# Patient Record
Sex: Female | Born: 1970
Health system: Southern US, Community
[De-identification: ages and names within clinical notes are randomized; demographics above are authoritative.]

## PROBLEM LIST (undated history)

## (undated) DIAGNOSIS — K611 Rectal abscess: Secondary | ICD-10-CM

## (undated) DIAGNOSIS — F329 Major depressive disorder, single episode, unspecified: Secondary | ICD-10-CM

## (undated) DIAGNOSIS — F32A Depression, unspecified: Secondary | ICD-10-CM

## (undated) DIAGNOSIS — E039 Hypothyroidism, unspecified: Secondary | ICD-10-CM

## (undated) HISTORY — PX: TONSILLECTOMY: SUR1361

## (undated) HISTORY — DX: Rectal abscess: K61.1

## (undated) HISTORY — PX: BREAST LUMPECTOMY: SHX2

## (undated) HISTORY — PX: THYROID SURGERY: SHX805

## (undated) HISTORY — PX: PATELLA RELEASE AND MANIPULATION: SHX2180

---

## 2007-01-26 ENCOUNTER — Inpatient Hospital Stay (HOSPITAL_COMMUNITY): Admission: AD | Admit: 2007-01-26 | Discharge: 2007-01-27 | Payer: Self-pay | Admitting: Gynecology

## 2007-01-29 ENCOUNTER — Inpatient Hospital Stay (HOSPITAL_COMMUNITY): Admission: AD | Admit: 2007-01-29 | Discharge: 2007-01-29 | Payer: Self-pay | Admitting: *Deleted

## 2007-01-31 ENCOUNTER — Inpatient Hospital Stay (HOSPITAL_COMMUNITY): Admission: AD | Admit: 2007-01-31 | Discharge: 2007-01-31 | Payer: Self-pay | Admitting: Obstetrics & Gynecology

## 2007-02-02 ENCOUNTER — Inpatient Hospital Stay (HOSPITAL_COMMUNITY): Admission: AD | Admit: 2007-02-02 | Discharge: 2007-02-02 | Payer: Self-pay | Admitting: *Deleted

## 2008-03-03 ENCOUNTER — Encounter: Admission: RE | Admit: 2008-03-03 | Discharge: 2008-03-03 | Payer: Self-pay | Admitting: Family Medicine

## 2008-04-22 ENCOUNTER — Inpatient Hospital Stay (HOSPITAL_COMMUNITY): Admission: AD | Admit: 2008-04-22 | Discharge: 2008-04-22 | Payer: Self-pay | Admitting: Obstetrics and Gynecology

## 2008-07-21 HISTORY — PX: TUBAL LIGATION: SHX77

## 2008-07-27 IMAGING — US US OB COMP LESS 14 WK
1 series · 14 of 28 positions shown · non-contrast
Comparison: none

CLINICAL DATA: Left lower quadrant pain. Quantitative beta-hCG 319.

TRANSABDOMINAL AND TRANSVAGINAL OB ULTRASOUND, LESS THAN 14 WEEKS:

[Series 1: us ob comp less 14 wk · 0.29mm/px · 14 of 38 slices shown]
[im 2/38]
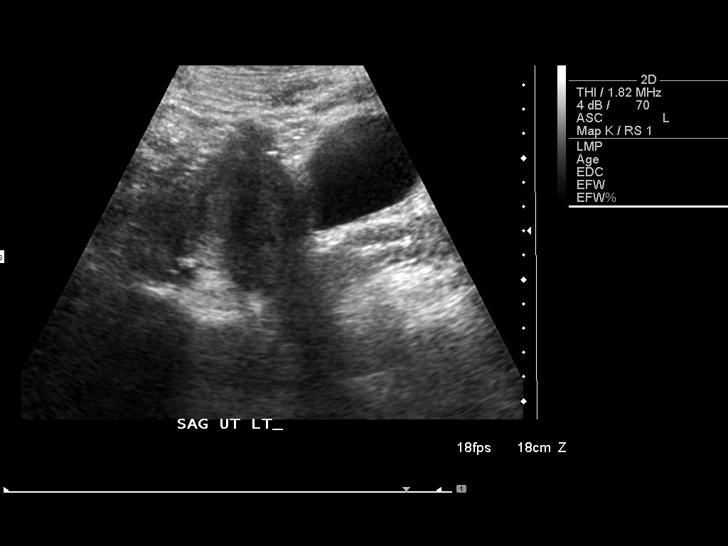
[im 5/38]
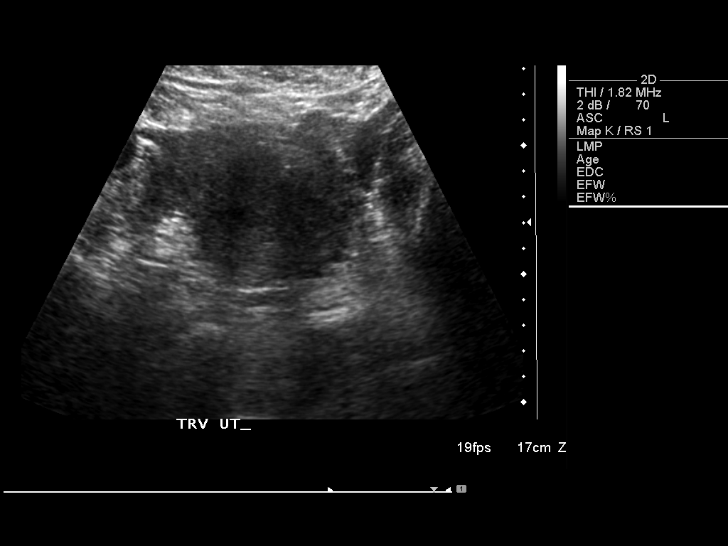
[im 7/38]
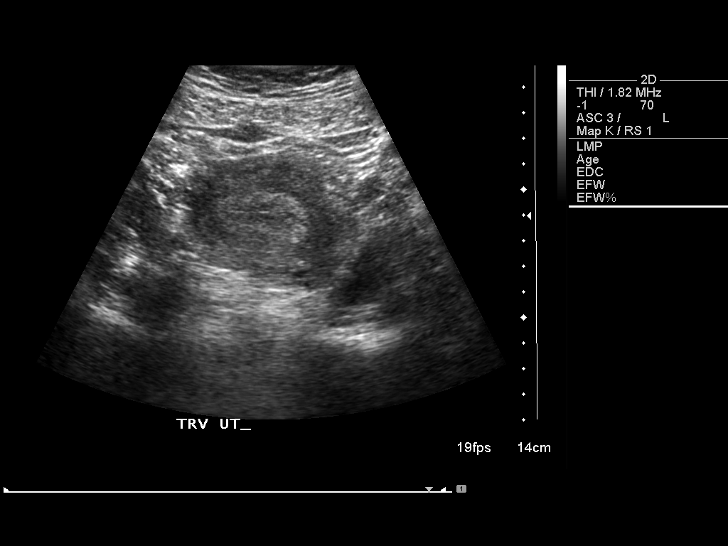
[im 10/38]
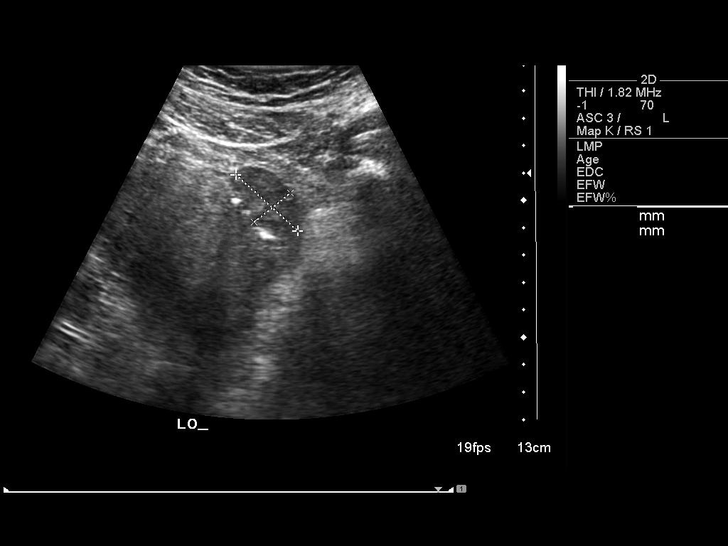
[im 13/38]
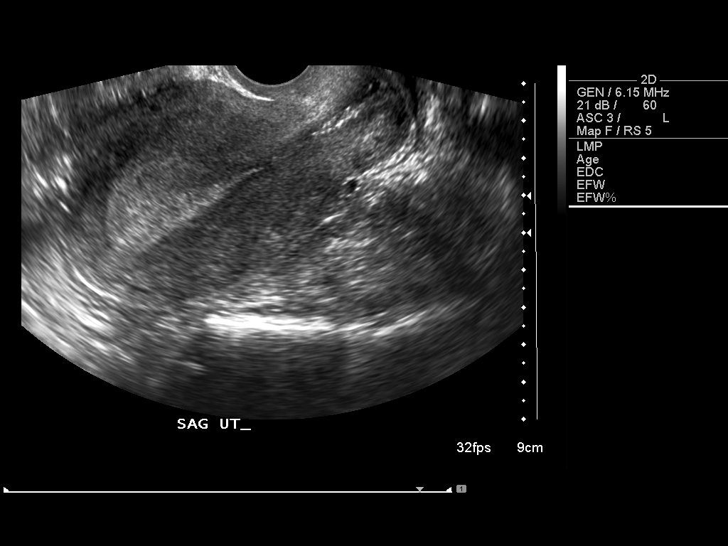
[im 16/38]
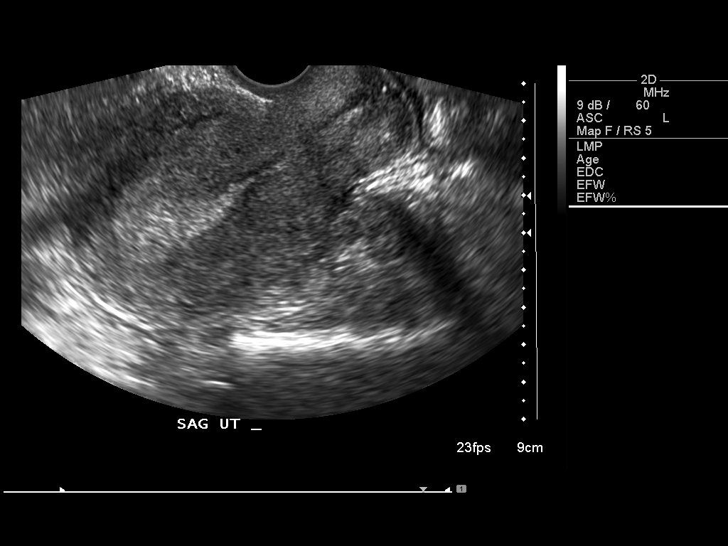
[im 18/38]
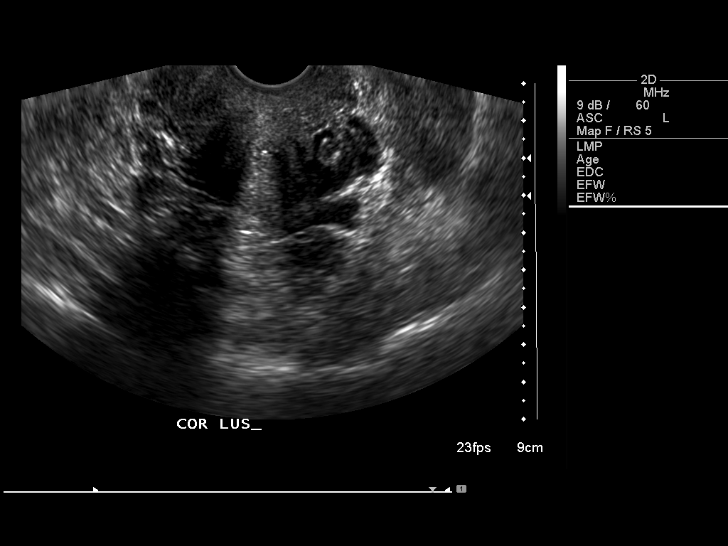
[im 21/38]
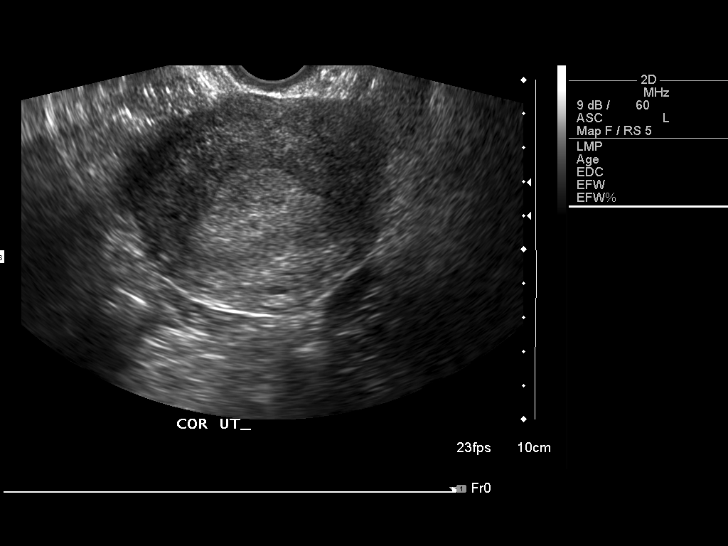
[im 24/38]
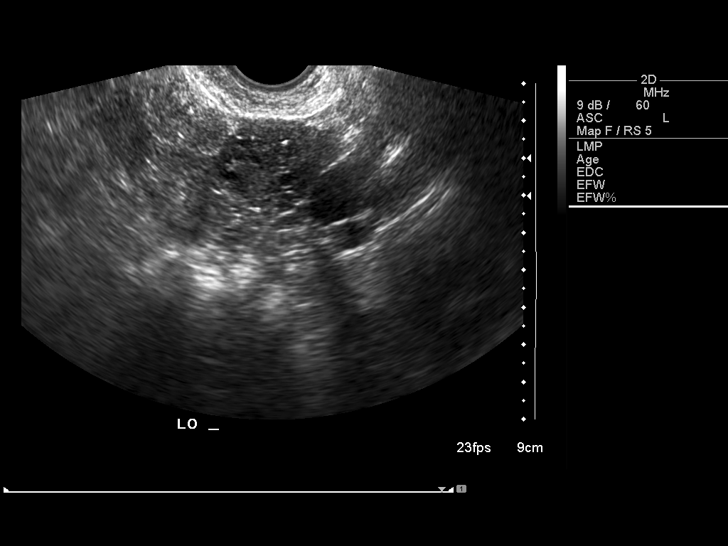
[im 27/38]
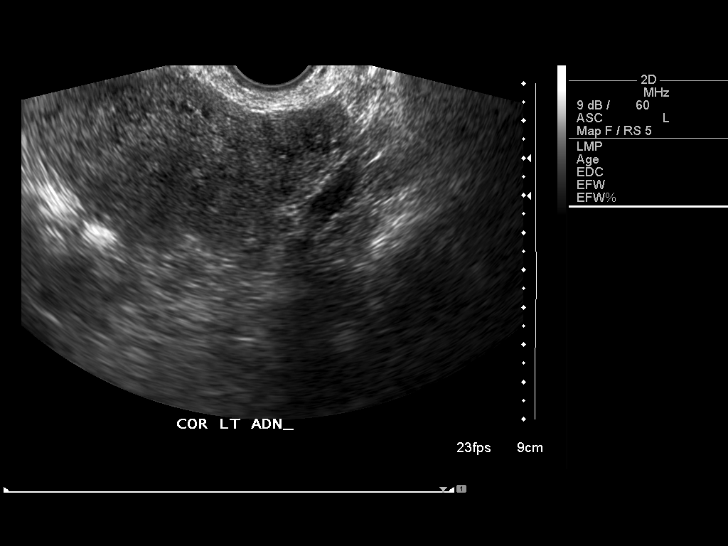
[im 29/38]
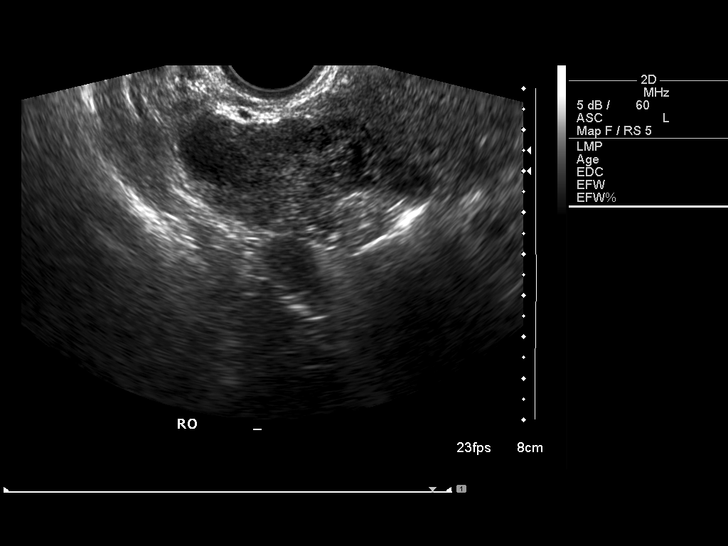
[im 32/38]
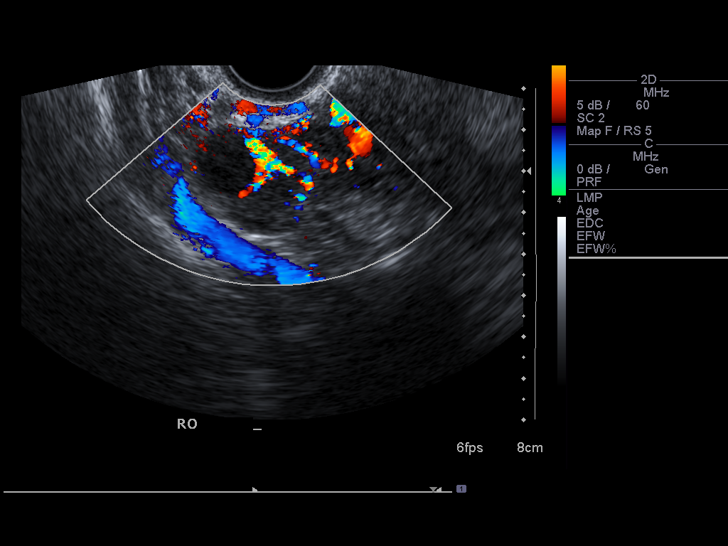
[im 35/38]
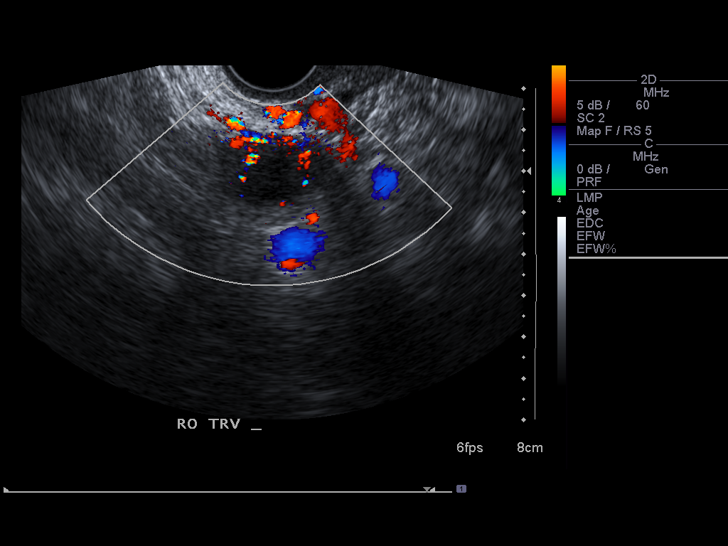
[im 38/38]
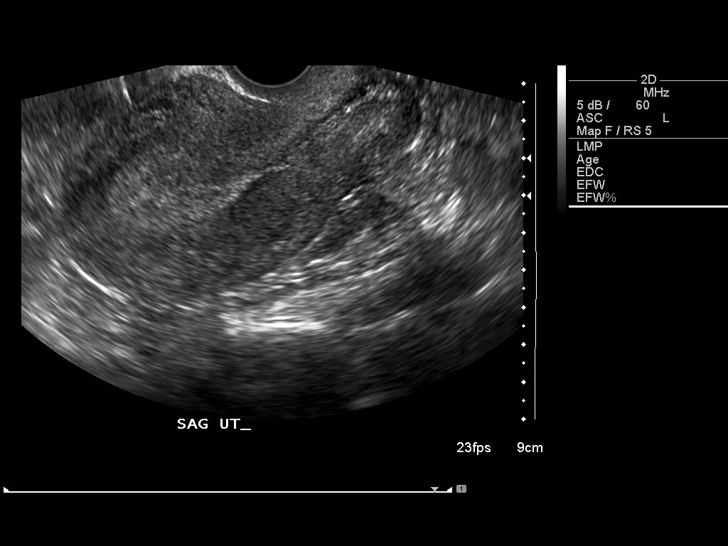

[14 of 28 positions shown; findings below may reference images not displayed]

FINDINGS: No intrauterine gestation is visualized. Ovaries are unremarkable.
Small corpus luteum cyst on the right. No adnexal masses. No free fluid.
IMPRESSION: No intrauterine gestation currently. With quantitative beta-hCG of 319,
differential diagnosis is early IUP to early to visualize, ectopic, or
spontaneous abortion. Recommend clinical followup and serial quantitative
beta-hCG's

## 2008-09-14 ENCOUNTER — Emergency Department (HOSPITAL_COMMUNITY): Admission: EM | Admit: 2008-09-14 | Discharge: 2008-09-14 | Payer: Self-pay | Admitting: Emergency Medicine

## 2008-09-27 ENCOUNTER — Encounter: Admission: RE | Admit: 2008-09-27 | Discharge: 2008-09-27 | Payer: Self-pay | Admitting: Obstetrics and Gynecology

## 2008-12-11 ENCOUNTER — Inpatient Hospital Stay (HOSPITAL_COMMUNITY): Admission: AD | Admit: 2008-12-11 | Discharge: 2008-12-11 | Payer: Self-pay | Admitting: Obstetrics and Gynecology

## 2008-12-21 ENCOUNTER — Inpatient Hospital Stay (HOSPITAL_COMMUNITY): Admission: RE | Admit: 2008-12-21 | Discharge: 2008-12-24 | Payer: Self-pay | Admitting: Obstetrics and Gynecology

## 2010-01-29 ENCOUNTER — Emergency Department (HOSPITAL_COMMUNITY): Admission: EM | Admit: 2010-01-29 | Discharge: 2010-01-29 | Payer: Self-pay | Admitting: Emergency Medicine

## 2010-03-04 ENCOUNTER — Encounter: Admission: RE | Admit: 2010-03-04 | Discharge: 2010-03-04 | Payer: Self-pay | Admitting: Sports Medicine

## 2010-08-12 ENCOUNTER — Encounter: Payer: Self-pay | Admitting: Family Medicine

## 2010-10-28 LAB — COMPREHENSIVE METABOLIC PANEL
ALT: 15 U/L (ref 0–35)
AST: 22 U/L (ref 0–37)
Alkaline Phosphatase: 149 U/L — ABNORMAL HIGH (ref 39–117)
BUN: 1 mg/dL — ABNORMAL LOW (ref 6–23)
CO2: 27 mEq/L (ref 19–32)
Chloride: 108 mEq/L (ref 96–112)
Creatinine, Ser: 0.66 mg/dL (ref 0.4–1.2)
GFR calc non Af Amer: >60 mL/min (ref >60)
Potassium: 4.1 mEq/L (ref 3.5–5.1)
Sodium: 138 mEq/L (ref 135–145)
Total Bilirubin: 0.2 mg/dL — ABNORMAL LOW (ref 0.3–1.2)

## 2010-10-28 LAB — CBC
HCT: 31.1 % — ABNORMAL LOW (ref 36.0–46.0)
HCT: 35.4 % — ABNORMAL LOW (ref 36.0–46.0)
MCV: 88.4 fL (ref 78.0–100.0)
Platelets: 115 10*3/uL — ABNORMAL LOW (ref 150–400)
Platelets: 137 10*3/uL — ABNORMAL LOW (ref 150–400)
RBC: 4.03 MIL/uL (ref 3.87–5.11)
RDW: 14.9 % (ref 11.5–15.5)
RDW: 14.9 % (ref 11.5–15.5)

## 2010-10-28 LAB — TYPE AND SCREEN: ABO/RH(D): A POS

## 2010-10-28 LAB — GLUCOSE, CAPILLARY: Glucose-Capillary: 93 mg/dL (ref 70–99)

## 2010-10-28 LAB — PROTIME-INR: INR: 1 (ref 0.00–1.49)

## 2010-10-28 LAB — HEMOGLOBIN A1C
Hgb A1c MFr Bld: 6.1 % (ref 4.6–6.1)
Mean Plasma Glucose: 128 mg/dL

## 2010-10-29 LAB — GLUCOSE, CAPILLARY

## 2010-11-05 LAB — URINALYSIS, ROUTINE W REFLEX MICROSCOPIC
Bilirubin Urine: NEGATIVE
Glucose, UA: NEGATIVE mg/dL
Hgb urine dipstick: NEGATIVE
Nitrite: NEGATIVE
Specific Gravity, Urine: 1.015 (ref 1.005–1.030)
pH: 7.5 (ref 5.0–8.0)

## 2010-12-03 NOTE — H&P (Signed)
[name], [name]              ACCOUNT NO.:  [id number]   MEDICAL RECORD NO.:  [id number]          PATIENT TYPE:  MAT   LOCATION:  [facility]                          [facility]ACILITY:  [facility]   PHYSICIAN:  Granby B. Earlene Plater, M.D.  DATE OF BIRTH:  [birth year]   DATE OF ADMISSION:  02/02/2007  DATE OF DISCHARGE:  02/02/2007                              HISTORY & PHYSICAL   CHIEF COMPLAINT:  Left lower quadrant pain.   HISTORY OF PRESENT ILLNESS:  This is a 40 year old white female, with  one previous pregnancy 11 years ago, presents with two weeks of  intermittent left lower quadrant pain, sharp or crampy, without fever,  nausea, vomiting, urinary symptoms, or problems with constipation or  diarrhea.  Serial quantitative hCGs have been followed and have been  rising appropriately.  Serial ultrasounds have also been followed in  maternity admissions to rule out ectopy pregnancy.  Initially a small  intrauterine sac was seen, but no yoke sac fetal pole.  A subsequent  ultrasound showed a questionable mass in the left adnexa separate from  the left ovary/cannot rule out heterotopic pregnancy.  The patient  returns today for another repeat ultrasound and follow-up of  quantitative hCG.   PAST MEDICAL HISTORY:  Past medical history is otherwise negative.  No  history of kidney stones.   PHYSICAL EXAMINATION:  VITAL SIGNS:  The patient is afebrile.  Stable  vital signs.  GENERAL:  He is alert and oriented, in no acute distress.  SKIN:  Skin is warm and dry, without lesions.  PELVIC EXAM:  Deferred.   LABORATORIES:  Hemoglobin is 14.7, white count 8.2, quantitative hCG  7079.  Ultrasound today shows intrauterine gestational sac with a yolk  sac.  Bilateral ovaries appear normal.  No evidence for adnexal mass or  fluid in the cul-de-sac or pelvis.   ASSESSMENT:  Intrauterine pregnancy, with left lower quadrant pain.  Doubt heterotopic pregnancy as this is quite rare, and this is a  spontaneous  conception.  Furthermore, there is no evidence for  heterotopic pregnancy on today's ultrasound.   PLAN:  Will check renal ultrasound to rule out for kidney stones.  Assuming this is negative, the patient will monitor symptoms, notify  should they worsen, otherwise, follow up in the office in one week for a  repeat ultrasound for viability.  The patient is given our contact  numbers and recommended that she call the office, rather than return to  the maternity visits for further follow-ups simply to facilitate her  care.      Gerri Spore B. Earlene Plater, M.D.  Electronically Signed     WBD/MEDQ  D:  02/02/2007  T:  02/03/2007  Job:  [id number]

## 2010-12-03 NOTE — H&P (Signed)
[name], [name]              ACCOUNT NO.:  [id number]   MEDICAL RECORD NO.:  [id number]          PATIENT TYPE:  INP   LOCATION:                                [facility]   PHYSICIAN:  Natalie H. Tenny Craw, MD     DATE OF BIRTH:  [birth year]   DATE OF ADMISSION:  12/21/2008  DATE OF DISCHARGE:                              HISTORY & PHYSICAL   CHIEF COMPLAINT:  Scheduled primary low transverse cesarean section.   HISTORY OF PRESENT ILLNESS:  [name] is a 40 year old, gravida 4,  para 0-1-2-1, who presents at 42 weeks and 1 day estimated gestational  age for a primary low transverse cesarean section.  Her pregnancy has  been complicated by advanced maternal age, A2 gestational diabetes  mellitus requiring insulin therapy, fetal macrosomia, mild right renal  pyelectasis of the fetus, and a history of a prior shoulder dystocia  with a fourth-degree perineal laceration.  For these multiple reasons,  she presents today for a scheduled primary low transverse cesarean  section.   PAST MEDICAL HISTORY:  1. Advanced maternal age.  2. History of Hashimoto thyroiditis status post left thyroidectomy in      2004.  3. 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   PAST SURGICAL HISTORY:  1. In 2004, left thyroidectomy.  2. Laparoscopy at 40 years of age.  3. Breast lumpectomy on the left at 40 years of age.  4. Left knee arthroscopy for patellar release.   PAST OBSTETRIC HISTORY:  1. In 1990, an elective pregnancy termination.  2. In May 1997, a female infant was born by vaginal delivery at 64      weeks' gestational age weighing 7 pounds 9 ounces.  This delivery      was complicated with shoulder dystocia and a fourth-degree perineal      laceration.  No permanent neurologic injury to the child.  3. In July 2008, a missed abortion.   PAST GYNECOLOGIC HISTORY:  History of a high-grade squamous  intraepithelial lesion Pap smear during this pregnancy.  Colposcopy  without biopsy was performed.  A  repeat Pap smear was also performed  which came back ASCUS.  Plan for colposcopy with biopsy to be performed  postpartum.  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  No  history of gonorrhea, Chlamydia, herpes or syphilis.  Current pregnancy.   PRENATAL LABORATORY DATA:  A positive, antibody negative, RPR  nonreactive, rubella titer immune, hepatitis B surface antigen negative,  HIV nonreactive, gonorrhea and Chlamydia negative, GBS negative.  First  trimester screen, no increased risk.  Maternal serum AFP, no increased  risk.  One-hour Glucola 240.  Three-hour Glucola also abnormal.  The  patient was diagnosed with gestational diabetes, was originally treated  with glyburide; however, this did not control her blood sugars, and she  was initiated on insulin therapy.  She was also diagnosed with fetal  macrosomia.  An ultrasound on Nov 24, 2007, demonstrated an estimated  fetal weight of 3908 g which is 8 pounds 10 ounces and this is greater  than the 95th percentile.  An amniotic fluid index is 18.4.  At 31 weeks  of gestation, she was also diagnosed with mild right fetal pyelectasis  greater than 7 mm.  This has been stable and we have been following this  with the assistance of Dr. Particia Nearing at [facility].   PHYSICAL EXAMINATION:  Weight 224, blood pressure 110/60.  Alert and oriented x3 in no apparent distress.  Abdomen is gravid, soft, nontender.  No clubbing, cyanosis, 1+ edema   ASSESSMENT AND PLAN:  This is a 40 year old, G4, P0-1-2-1, at 39-2, who  presents for a scheduled primary low transverse cesarean section.  She  also desires permanent sterilization and a tubal ligation will be  performed at the time of C-section.      [name]. Tenny Craw, MD  Electronically Signed     KHR/MEDQ  D:  11/30/2008  T:  11/30/2008  Job:  [id number]

## 2010-12-03 NOTE — Op Note (Signed)
[name], [name]      ACCOUNT NO.:  [id number]   MEDICAL RECORD NO.:  [id number]          PATIENT TYPE:  INP   LOCATION:  9103                          FACILITY:  [facility]   PHYSICIAN:  Kendra H. Tenny Craw, MD     DATE OF BIRTH:  [birth year]   DATE OF PROCEDURE:  12/21/2008  DATE OF DISCHARGE:                               OPERATIVE REPORT   PREOPERATIVE DIAGNOSES:  1. A 39 and 1-week intrauterine pregnancy.  2. Advanced maternal age.  3. A2 diabetes mellitus on insulin,  4. History of shoulder dystocia with a fourth-degree perineal      laceration with her first delivery.  5. Suspected fetal macrosomia.  6. Desired permanent sterilization.   POSTOPERATIVE DIAGNOSES:  1. A 39 and 1-week intrauterine pregnancy.  2. Advanced maternal age.  3. A2 diabetes mellitus on insulin,  4. History of shoulder dystocia with a fourth-degree perineal      laceration with her first delivery.  5. Suspected fetal macrosomia.  6. Desired permanent sterilization.   PROCEDURE:  Primary low transverse cesarean section via Pfannenstiel  skin incision with bilateral tubal ligation with Filshie clips.   SURGEON:  Freddrick March. Tenny Craw, MD   ASSISTANT:  Miguel Aschoff, MD   ANESTHESIA:  Spinal.   OPERATIVE FINDINGS:  Vigorous female infant in the vertex presentation  weighing 10 pounds 1 ounce with Apgars of 9 at 1 and 9 at 5 minutes.  The patient had an enlarged uterus which could not be exteriorized for  repair of the uterine incision and therefore was left in situ.  Normal  ovaries and tubes bilaterally.   SPECIMENS:  Placenta.   DISPOSITION OF SPECIMENS:  To L and D for disposal.   ESTIMATED BLOOD LOSS:  700 mL.   COMPLICATIONS:  None.   PROCEDURE:  Ms. [name] is a 40 year old, gravida 3, para 0-1-1-1, at 31  weeks and 1 day estimated gestational age, who presents today for a  scheduled primary low transverse cesarean section for gestational  diabetes on insulin, suspected fetal macrosomia,  desired permanent  sterilization, and history of a shoulder dystocia with fourth-degree  perineal laceration with her first delivery.  Approximately 13 years  ago, the patient delivered her first child vaginally as she experienced  shoulder dystocia at that time.  The baby was born at 35 weeks with a  weight of 7 pounds 9 ounces.  She did experience a fourth-degree  laceration with that delivery.  During this pregnancy, her pregnancy was  complicated by gestational diabetes requiring insulin to control her  blood sugars.  Serial ultrasounds were performed to monitor fetal growth  during this pregnancy.  The last growth ultrasound performed at 35 weeks  gave an estimated gestational age of 17 g.  In discussing the  suspected fetal macrosomia, and route of delivery it was decided to  proceed with primary low transverse cesarean section for delivery.  The  risks, benefits, and alternatives of this procedure were discussed with  the patient, and we proceeded with cesarean section.   Following the appropriate informed consent, the patient was brought to  the operating room where spinal anesthesia was administered  and found to  be adequate.  She was placed in the dorsal supine position with a  leftward tilt, prepped and draped in the normal sterile fashion.  Allis  test demonstrated adequate anesthesia.  A scalpel was used to make a  Pfannenstiel skin incision which was carried down through the underlying  layers of soft tissue to the fascia.  Fascia was incised in the midline.  Fascial incision was extended laterally with Mayo scissors.  Superior  aspect of the fascial incision was grasped with Kocher clamps x2,  dissected off sharply with the electrocautery unit.  The same procedure  was repeated on the inferior aspect of the fascial incision.  The rectus  muscles were then separated in the midline abdominal peritoneum was  identified, tented up, entered sharply with the Metzenbaum, and  the  incision was extended superiorly and inferiorly with good visualization  of the bladder.  Bladder blade was then inserted.  Vesicouterine  peritoneum was identified, tented up, entered sharply with the  Metzenbaum.  The incision was extended laterally with the Metzenbaum.  The bladder flap was created digitally.  Scalpel was then used to make a  low transverse incision on the uterus which was extended laterally with  blunt dissection.  The bandage scissors were then used to extend the  uterine incision on the left hand aspect of the incision.  Amniotomy was  performed for clear fluid.  Fetal vertex was identified and delivered  atraumatically through the uterine incision followed by the body.  Cord  was clamped and cut.  Infant cried vigorously on the operative field and  was bulb suctioned and passed to the waiting pediatricians.  The  placenta was then spontaneously delivered.  The uterus was cleared of  all clot and debris.  However, given the size of the uterus it could not  be exteriorized for repair of the uterine incision and therefore it was  left in situ.  Ring forceps were placed on the angles of the uterine  incision as well as the upper and lower portions of the uterine  incision, and the uterine incision was reapproximated with a #1 chromic  in a running locked fashion followed by a second imbricating layer.  Several figure-of-eight sutures with chromic were required to achieve  complete hemostasis.  At this time, attention was then turned to the  ovaries and tubes.  The patient desired a tubal ligation, and given the  inability to exteriorize the uterus, decision was made to perform the  tubal ligation procedure with Filshie clips.  The tube on the left side  was identified, grasped with Babcock clamps.  The midportion of the  fallopian tube was identified.  The Filshie clip was applied and noted  to completely encircle the fallopian tube and was hemostatic.  The tube   was then returned to the abdominal cavity, and the same procedure was  repeated on the right hand fallopian tube.  The abdominal cavity was  then irrigated of all clot and debris.  The uterine incision was  reinspected and found to be hemostatic.  The abdominal peritoneum was  reapproximated with 2-0 Vicryl in a running fashion.  The rectus muscles  were reapproximated with #1 chromic in a running fashion.  The fascia  was reapproximated with 0 looped PDS in a running fashion.  Interrupted  stitches were used to reapproximate the subcutaneous tissue with 2-0  plain gut, and the skin was closed with staples.  All sponge, lap,  needle  counts were correct x2.  The patient tolerated the procedure well  and was brought to the recovery room in stable condition following the  procedure.      Freddrick March. Tenny Craw, MD  Electronically Signed    KHR/MEDQ  D:  12/21/2008  T:  12/22/2008  Job:  [id number]

## 2010-12-06 NOTE — Discharge Summary (Signed)
[name], [name]      ACCOUNT NO.:  [id number]   MEDICAL RECORD NO.:  [id number]          PATIENT TYPE:  INP   LOCATION:  9103                          [facility]   PHYSICIAN:  Kendra H. Tenny Craw, MD     DATE OF BIRTH:  12/03/1970   DATE OF ADMISSION:  12/21/2008  DATE OF DISCHARGE:  12/24/2008                               DISCHARGE SUMMARY   FINAL DIAGNOSES:  Intrauterine gestation at 57 and 1/7 weeks,  gestational diabetes mellitus requiring insulin, history of shoulder  dystocia and fourth degree perineal laceration with first delivery,  suspected fetal macrosomia, desired permanent sterilization.   PROCEDURE:  Primary low transverse cesarean section with bilateral tubal  ligation using Filshie clips.   SURGEON:  Freddrick March. Tenny Craw, MD   ASSISTANT:  Miguel Aschoff, MD   COMPLICATIONS:  None.   This 40 year old G70, P1-0-2-1 presents at 20 and 1/7 weeks' gestation  for primary cesarean section.  The patient had a vaginal delivery with  her first pregnancy that resulted in shoulder dystocia and fourth degree  perineal laceration.  The patient also had a history of preeclampsia  with her first pregnancy.  She was gestational diabetic with this  pregnancy and required insulin to control her blood sugars.  All  antepartum testing has been reactive and within normal limits up to this  point.  The patient also expresses her desire for permanent  sterilization and fetal macrosomia suspected.  Discussion was held with  the patient and decision was made to proceed with a primary cesarean  section and tubal ligation.  The patient was taken to the operating room  on December 21, 2008 by Dr. Waynard Reeds, where a primary low transverse  cesarean section was performed with the delivery of 10-pound-1-ounce  female infant with Apgars of 9 and 9.  Delivery went without  complications.  At this point, a bilateral tubal ligation was performed  using Filshie clips, that procedure went without  complications as well.  The patient's postoperative course was benign without any significant  fevers.  The patient had her little boy circumcised prior to discharge.  The patient was felt ready for discharge on postoperative day #3.  She  was sent home on a regular diet, told to decrease activities, told to  continue her prenatal vitamins, was given Percocet 1-2 every 4-6 hours  as needed for pain, was to follow up in our office in 1 week to check  her incision.  Instructions and precautions were reviewed with the  patient.   LABS ON DISCHARGE:  The patient had a hemoglobin of 10.9, white blood  cell count of 10.1, and platelets of 115,000.      [name], P.A.-C.      Freddrick March. Tenny Craw, MD  Electronically Signed    MB/MEDQ  D:  01/08/2009  T:  01/09/2009  Job:  [id number]

## 2011-05-05 LAB — URINALYSIS, ROUTINE W REFLEX MICROSCOPIC
Bilirubin Urine: NEGATIVE
Glucose, UA: NEGATIVE
Hgb urine dipstick: NEGATIVE
Protein, ur: NEGATIVE
Specific Gravity, Urine: >1.03 — ABNORMAL HIGH

## 2011-05-05 LAB — CBC
HCT: 43.4
Hemoglobin: 14.7
MCV: 89
RDW: 13.2

## 2011-05-05 LAB — URINE MICROSCOPIC-ADD ON

## 2011-05-06 LAB — CBC
HCT: 40.2
Hemoglobin: 13.6
MCHC: 33.9
MCV: 88.3
RBC: 4.55

## 2011-05-06 LAB — URINE MICROSCOPIC-ADD ON

## 2011-05-06 LAB — URINALYSIS, ROUTINE W REFLEX MICROSCOPIC
Bilirubin Urine: NEGATIVE
Bilirubin Urine: NEGATIVE
Glucose, UA: NEGATIVE
Glucose, UA: NEGATIVE
Hgb urine dipstick: NEGATIVE
Ketones, ur: NEGATIVE
Nitrite: NEGATIVE
Specific Gravity, Urine: 1.02
pH: 7
pH: 7.5

## 2011-05-06 LAB — WET PREP, GENITAL
Clue Cells Wet Prep HPF POC: NONE SEEN
Trich, Wet Prep: NONE SEEN
Yeast Wet Prep HPF POC: NONE SEEN

## 2011-05-06 LAB — POCT PREGNANCY, URINE
Operator id: 251141
Preg Test, Ur: POSITIVE

## 2012-02-23 ENCOUNTER — Emergency Department (HOSPITAL_COMMUNITY)
Admission: EM | Admit: 2012-02-23 | Discharge: 2012-02-23 | Disposition: A | Discharge status: Home / Self Care | Payer: Medicaid Other | Attending: Emergency Medicine | Admitting: Emergency Medicine

## 2012-02-23 ENCOUNTER — Encounter (HOSPITAL_COMMUNITY): Payer: Self-pay | Admitting: Emergency Medicine

## 2012-02-23 DIAGNOSIS — Z794 Long term (current) use of insulin: Secondary | ICD-10-CM | POA: Insufficient documentation

## 2012-02-23 DIAGNOSIS — F3289 Other specified depressive episodes: Secondary | ICD-10-CM | POA: Insufficient documentation

## 2012-02-23 DIAGNOSIS — E119 Type 2 diabetes mellitus without complications: Secondary | ICD-10-CM | POA: Insufficient documentation

## 2012-02-23 DIAGNOSIS — F329 Major depressive disorder, single episode, unspecified: Secondary | ICD-10-CM | POA: Insufficient documentation

## 2012-02-23 DIAGNOSIS — L02419 Cutaneous abscess of limb, unspecified: Secondary | ICD-10-CM | POA: Insufficient documentation

## 2012-02-23 DIAGNOSIS — Z79899 Other long term (current) drug therapy: Secondary | ICD-10-CM | POA: Insufficient documentation

## 2012-02-23 DIAGNOSIS — L0291 Cutaneous abscess, unspecified: Secondary | ICD-10-CM

## 2012-02-23 HISTORY — DX: Major depressive disorder, single episode, unspecified: F32.9

## 2012-02-23 HISTORY — DX: Depression, unspecified: F32.A

## 2012-02-23 MED ORDER — OXYCODONE-ACETAMINOPHEN 5-325 MG PO TABS
1.0000 | ORAL_TABLET | Freq: Once | ORAL | Status: AC
  Administered 2012-02-23: 1 via ORAL
  Filled 2012-02-23: qty 1

## 2012-02-23 MED ORDER — OXYCODONE-ACETAMINOPHEN 5-325 MG PO TABS: 2.0000 | ORAL_TABLET | ORAL | Status: DC | PRN

## 2012-02-23 MED ORDER — CEPHALEXIN 500 MG PO CAPS: 500.0000 mg | ORAL_CAPSULE | Freq: Four times a day (QID) | ORAL | Status: DC

## 2012-02-23 NOTE — ED Provider Notes (Signed)
History  This chart was scribed for Natalie Guppy, MD by [name]. The patient was seen in room TR04C/TR04C. Patient's care was started at 1814.     CSN: [id number]  Arrival date & time 02/23/12  1814   First MD Initiated Contact with Patient 02/23/12 1849      Chief Complaint  Patient presents with  . Abscess    (Consider location/radiation/quality/duration/timing/severity/associated sxs/prior treatment) The history is provided by the patient. No language interpreter was used.    [name] is a 41 y.o. Female who presents to the Emergency Department complaining of an abscess to her left inner thigh 3 days ago. Associated symptoms include redness, swelling and tenderness. Patient visited her PCP today and was told to come to the ED for an I & D procedure. Patient has a medical history of diabetes and depression. Her surgical history includes C-section, breast lumpectomy, patella release and manipulation and tonsillectomy. She has never smoked.     Past Medical History  Diagnosis Date  . Diabetes mellitus   . Depression     Past Surgical History  Procedure Date  . Cesarean section 2010  . Breast lumpectomy   . Patella release and manipulation   . Tonsillectomy     History reviewed. No pertinent family history.  History  Substance Use Topics  . Smoking status: Never Smoker   . Smokeless tobacco: Not on file  . Alcohol Use: Yes     occasion    OB History    Grav Para Term Preterm Abortions TAB SAB Ect Mult Living                  Review of Systems  Skin:       Positive for abscess.    Allergies  Review of patient's allergies indicates no known allergies.  Home Medications   Current Outpatient Rx  Name Route Sig Dispense Refill  . BUPROPION HCL ER (XL) 150 MG PO TB24 Oral Take 150 mg by mouth daily.    Marland Kitchen CITALOPRAM HYDROBROMIDE 40 MG PO TABS Oral Take 40 mg by mouth daily.    Marland Kitchen HYDROCODONE-ACETAMINOPHEN 7.5-750 MG PO TABS Oral Take 1  tablet by mouth every 6 (six) hours as needed. For pain    . INSULIN GLARGINE 100 UNIT/ML [place] SOLN Subcutaneous Inject 25 Units into the skin at bedtime.    Marland Kitchen METFORMIN HCL 1000 MG PO TABS Oral Take 1,000 mg by mouth 2 (two) times daily with a meal.    . NORTRIPTYLINE HCL 50 MG PO CAPS Oral Take 100 mg by mouth at bedtime.      BP 116/67  Pulse 100  Temp 98.8 F (37.1 C) (Oral)  Resp 18  Ht 5\' 6"  (1.676 m)  Wt 173 lb (78.472 kg)  BMI 27.92 kg/m2  SpO2 99%  Physical Exam  ED Course  Procedures (including critical care time) DIAGNOSTIC STUDIES: Oxygen Saturation is 99% on room air, normal by my interpretation.    COORDINATION OF CARE: 7:13pm- Patient informed of current plan for treatment and evaluation and agrees with plan at this time.    INCISION AND DRAINAGE PROCEDURE NOTE: Patient identification was confirmed and verbal consent was obtained. This procedure was performed by Natalie Guppy, MD at 7:36 PM. Site: right buttock Sterile procedures observed yes Needle size: 23 g Anesthetic used (type and amt): lido with epi 3 ml Blade size: 11 Drainage: large amount Complexity: Complex Packing used iodofoam  Site anesthetized, incision made over site,  wound drained and explored loculations, rinsed with copious amounts of normal saline, wound packed with sterile gauze, covered with dry, sterile dressing.  Pt tolerated procedure well without complications.  Instructions for care discussed verbally and pt provided with additional written instructions for homecare and f/u.   RN Tresa Endo chaperone due to location next to perineum   Labs Reviewed - No data to display  No results found.   No diagnosis found.    MDM  Abscess Drained and packed       I personally performed the services described in this documentation, which was scribed in my presence. The recorded information has been reviewed and considered.    Natalie Guppy, MD 02/23/12 2012

## 2012-02-23 NOTE — ED Notes (Signed)
Pt reports noticing what thought was bug bite to inner thigh on Friday-that has grown over weekend--now reports swollen, red and tender--went to PCP today and was sent here for drainage

## 2012-02-24 ENCOUNTER — Encounter (HOSPITAL_COMMUNITY): Payer: Self-pay | Admitting: *Deleted

## 2012-02-24 ENCOUNTER — Inpatient Hospital Stay (HOSPITAL_COMMUNITY)
Admission: EM | Admit: 2012-02-24 | Discharge: 2012-02-29 | DRG: 581 | Disposition: A | Discharge status: Home / Self Care | Payer: Medicaid Other | Attending: Internal Medicine | Admitting: Internal Medicine

## 2012-02-24 DIAGNOSIS — R739 Hyperglycemia, unspecified: Secondary | ICD-10-CM

## 2012-02-24 DIAGNOSIS — E119 Type 2 diabetes mellitus without complications: Secondary | ICD-10-CM

## 2012-02-24 DIAGNOSIS — B379 Candidiasis, unspecified: Secondary | ICD-10-CM | POA: Diagnosis not present

## 2012-02-24 DIAGNOSIS — E1165 Type 2 diabetes mellitus with hyperglycemia: Secondary | ICD-10-CM

## 2012-02-24 DIAGNOSIS — L0390 Cellulitis, unspecified: Secondary | ICD-10-CM

## 2012-02-24 DIAGNOSIS — B3731 Acute candidiasis of vulva and vagina: Secondary | ICD-10-CM | POA: Diagnosis present

## 2012-02-24 DIAGNOSIS — IMO0001 Reserved for inherently not codable concepts without codable children: Secondary | ICD-10-CM | POA: Diagnosis present

## 2012-02-24 DIAGNOSIS — L03317 Cellulitis of buttock: Secondary | ICD-10-CM | POA: Diagnosis present

## 2012-02-24 DIAGNOSIS — IMO0002 Reserved for concepts with insufficient information to code with codable children: Secondary | ICD-10-CM | POA: Diagnosis present

## 2012-02-24 DIAGNOSIS — L02219 Cutaneous abscess of trunk, unspecified: Principal | ICD-10-CM | POA: Diagnosis present

## 2012-02-24 DIAGNOSIS — L03319 Cellulitis of trunk, unspecified: Principal | ICD-10-CM | POA: Diagnosis present

## 2012-02-24 DIAGNOSIS — B373 Candidiasis of vulva and vagina: Secondary | ICD-10-CM | POA: Diagnosis present

## 2012-02-24 DIAGNOSIS — R300 Dysuria: Secondary | ICD-10-CM

## 2012-02-24 DIAGNOSIS — F329 Major depressive disorder, single episode, unspecified: Secondary | ICD-10-CM | POA: Diagnosis present

## 2012-02-24 DIAGNOSIS — F3289 Other specified depressive episodes: Secondary | ICD-10-CM | POA: Diagnosis present

## 2012-02-24 DIAGNOSIS — L0291 Cutaneous abscess, unspecified: Secondary | ICD-10-CM

## 2012-02-24 LAB — BASIC METABOLIC PANEL
BUN: 7 mg/dL (ref 6–23)
CO2: 26 mEq/L (ref 19–32)
Calcium: 9.5 mg/dL (ref 8.4–10.5)
Chloride: 98 mEq/L (ref 96–112)
Creatinine, Ser: 0.49 mg/dL — ABNORMAL LOW (ref 0.50–1.10)
Sodium: 137 mEq/L (ref 135–145)

## 2012-02-24 LAB — CBC WITH DIFFERENTIAL/PLATELET
Basophils Absolute: 0 10*3/uL (ref 0.0–0.1)
Basophils Relative: 0 % (ref 0–1)
Eosinophils Absolute: 0.2 10*3/uL (ref 0.0–0.7)
Eosinophils Relative: 2 % (ref 0–5)
HCT: 39.5 % (ref 36.0–46.0)
Lymphocytes Relative: 23 % (ref 12–46)
MCHC: 36.2 g/dL — ABNORMAL HIGH (ref 30.0–36.0)
MCV: 85.1 fL (ref 78.0–100.0)
Monocytes Absolute: 0.7 10*3/uL (ref 0.1–1.0)
Platelets: 264 10*3/uL (ref 150–400)
RDW: 12.2 % (ref 11.5–15.5)
WBC: 9.8 10*3/uL (ref 4.0–10.5)

## 2012-02-24 LAB — GLUCOSE, CAPILLARY: Glucose-Capillary: 493 mg/dL — ABNORMAL HIGH (ref 70–99)

## 2012-02-24 MED ORDER — DEXTROSE-NACL 5-0.45 % IV SOLN
INTRAVENOUS | Status: DC
  Administered 2012-02-25: 03:00:00 via INTRAVENOUS

## 2012-02-24 MED ORDER — VANCOMYCIN HCL IN DEXTROSE 1-5 GM/200ML-% IV SOLN
1000.0000 mg | Freq: Once | INTRAVENOUS | Status: AC
  Administered 2012-02-24: 1000 mg via INTRAVENOUS
  Filled 2012-02-24: qty 200

## 2012-02-24 MED ORDER — SODIUM CHLORIDE 0.9 % IV SOLN
INTRAVENOUS | Status: AC
  Administered 2012-02-25: 01:00:00 via INTRAVENOUS
  Filled 2012-02-24: qty 1

## 2012-02-24 MED ORDER — DEXTROSE 50 % IV SOLN: 25.0000 mL | INTRAVENOUS | Status: DC | PRN

## 2012-02-24 MED ORDER — SODIUM CHLORIDE 0.9 % IV SOLN: Freq: Once | INTRAVENOUS | Status: DC

## 2012-02-24 MED ORDER — SODIUM CHLORIDE 0.9 % IV SOLN
INTRAVENOUS | Status: DC
  Administered 2012-02-24 (×2): via INTRAVENOUS

## 2012-02-24 MED ORDER — MORPHINE SULFATE 2 MG/ML IJ SOLN
2.0000 mg | Freq: Once | INTRAMUSCULAR | Status: AC
  Administered 2012-02-24: 2 mg via INTRAVENOUS
  Filled 2012-02-24: qty 1

## 2012-02-24 NOTE — ED Provider Notes (Signed)
History     CSN: [id number]  Arrival date & time 02/24/12  4098   First MD Initiated Contact with Patient 02/24/12 2250      Chief Complaint  Patient presents with  . Abcess   . Hyperglycemia    (Consider location/radiation/quality/duration/timing/severity/associated sxs/prior treatment) HPI Comments: This is a 41 year old woman with uncontrolled diabetes mellitus who presents with a gluteal abscess and cellulitis.  An absess was first noticed 4 days ago.  Yesterday she was evaluated by her PCP and he instructed her to come to the ED for an I&D.  This was performed by Dr. Weldon Inches, and at the time there was no concern for surrounding cellulitis.  She represented to her PCP today, complaining of increased erythema, tenderness, and purulence.  He again advised her to come to the ED for evaluation and treatment.  She reports a fever of 101 at the PCP where she was given 800mg  of ibuprofen.  Here she is afebrile.  She is complaining of painful defecation but no hematochezia.  She endorses dysuria but no hematuria.  She endorses nausea but no vomiting.  The pain associated with this now extends into her lower abdomen.   Past Medical History  Diagnosis Date  . Diabetes mellitus   . Depression     Past Surgical History  Procedure Date  . Cesarean section 2010  . Breast lumpectomy   . Patella release and manipulation   . Tonsillectomy     History reviewed. No pertinent family history.  History  Substance Use Topics  . Smoking status: Never Smoker   . Smokeless tobacco: Not on file  . Alcohol Use: Yes     occasion    OB History    Grav Para Term Preterm Abortions TAB SAB Ect Mult Living                  Review of Systems  All other systems reviewed and are negative.    Allergies  Review of patient's allergies indicates no known allergies.  Home Medications   Current Outpatient Rx  Name Route Sig Dispense Refill  . BUPROPION HCL ER (XL) 150 MG PO TB24 Oral Take 150  mg by mouth daily.    . CEPHALEXIN 500 MG PO CAPS Oral Take 1 capsule (500 mg total) by mouth 4 (four) times daily. 40 capsule 0  . CITALOPRAM HYDROBROMIDE 40 MG PO TABS Oral Take 40 mg by mouth daily.    . INSULIN GLARGINE 100 UNIT/ML [place] SOLN Subcutaneous Inject 25 Units into the skin at bedtime.    Marland Kitchen METFORMIN HCL 1000 MG PO TABS Oral Take 1,000 mg by mouth 2 (two) times daily with a meal.    . NORTRIPTYLINE HCL 50 MG PO CAPS Oral Take 100 mg by mouth at bedtime.    . OXYCODONE-ACETAMINOPHEN 5-325 MG PO TABS Oral Take 2 tablets by mouth every 4 (four) hours as needed for pain. 6 tablet 0    BP 133/88  Pulse 98  Temp 99.5 F (37.5 C) (Oral)  Resp 16  SpO2 100%  Physical Exam  Constitutional: Vital signs are normal. She appears well-developed and well-nourished.  Non-toxic appearance. No distress.  HENT:  Head: Normocephalic and atraumatic.  Nose: Nose normal.  Mouth/Throat: Uvula is midline, oropharynx is clear and moist and mucous membranes are normal.  Cardiovascular: Normal rate, regular rhythm and normal heart sounds.   Pulmonary/Chest: Effort normal and breath sounds normal.  Abdominal: Soft. Normal appearance and bowel sounds  are normal. She exhibits no mass. There is no hepatosplenomegaly. There is tenderness in the left lower quadrant. There is no rigidity and no guarding.  Genitourinary:    Pelvic exam was performed with patient prone.  Lymphadenopathy:       Left: Inguinal adenopathy present.  Neurological: She is alert.    ED Course  Procedures (including critical care time)  Labs Reviewed  GLUCOSE, CAPILLARY - Abnormal; Notable for the following:    Glucose-Capillary 493 (*)     All other components within normal limits  BASIC METABOLIC PANEL - Abnormal; Notable for the following:    Glucose, Bld 340 (*)     Creatinine, Ser 0.49 (*)     All other components within normal limits  CBC WITH DIFFERENTIAL - Abnormal; Notable for the following:    MCHC 36.2 (*)       All other components within normal limits  URINALYSIS, ROUTINE W REFLEX MICROSCOPIC  URINE CULTURE   No results found.   1. Cellulitis and abscess   2. Hyperglycemia without ketosis   3. Dysuria       MDM  1.   Abscess/cellulitis:  There was only mention of abscess in the ED note yesterday, so today's findings of a large area of cellulitis represent rapid progression.  The most common pathogens are S. aureus and S. pyogenes, but with rapid progression, community acquired MRSA is a concern.  For this reason we started treatment with vancomycin IV 1g.  Necrotizing cellulitis/fasciits is less likely since there is no crepitus with exam and no leukocytosis, but with such rapid progression a CT was ordered to assess for extend of infection.  IV morphine for pain.  Will need surgery consult for I&D.  2.   Hyperglycemia:  Patient is on Lantus 25U at night and metformin, but she admits poor control even on this regimen.  This likely contributed to #1.  Her sugar at presentation was 493, K was normal, bicarb was normal, and AG was 13.  She is not in DKA.  We will provide glycemic control with IV insulin via the GlucoStabilizer protocol and hold her metformin.  3.   Dysuria:  Could be secondary to #1 or represent primary UTI.  Urinalysis and urine culture pending.  4.   Disposition:  Deferred to Dr. Dierdre Highman, consult GenSurg after CT results.   Lollie Sails, MD 02/25/12 [phone]

## 2012-02-24 NOTE — ED Provider Notes (Signed)
[name] is a 41 y.o. female with progressive swelling, left buttocks, and perineum. Despite I&D yesterday. Using oral antibiotics, and usual medicines for diabetes. Her drain fell out, and she has persistent drainage from the I&D wound. She feels ill and has generalized aching. On exam, she has a large area of cellulitis, left buttock, extending from a locally, swollen, indurated area of the left inferior perineum.  The area of maximal swelling is moderately tender. No associated skin break down or crepitance, that would indicate subcutaneous air.  Plan as further evaluation, intravenous medications, and fluids, and imaging to rule out deep tissue infection.   Patient seen and evaluated with resident. Pertinent history and physical examination performed. Agree with initial assessment, evaluation and treatment initiated by resident. Face-to-face examination and review of evaluation findings were performed.   Disposition- CT then admit    Flint Melter, MD 02/26/12 2139

## 2012-02-24 NOTE — ED Notes (Signed)
Pt states she was here yesterday and had abscess drained on inner left thigh.  Fevers all day today, last 800 mg ibuprofen at 5:30 pm.  States all packing dislodged

## 2012-02-25 ENCOUNTER — Emergency Department (HOSPITAL_COMMUNITY): Payer: Medicaid Other

## 2012-02-25 ENCOUNTER — Encounter (HOSPITAL_COMMUNITY): Payer: Self-pay | Admitting: Radiology

## 2012-02-25 DIAGNOSIS — E119 Type 2 diabetes mellitus without complications: Secondary | ICD-10-CM

## 2012-02-25 DIAGNOSIS — L0231 Cutaneous abscess of buttock: Secondary | ICD-10-CM

## 2012-02-25 DIAGNOSIS — E1165 Type 2 diabetes mellitus with hyperglycemia: Secondary | ICD-10-CM | POA: Diagnosis present

## 2012-02-25 DIAGNOSIS — L0390 Cellulitis, unspecified: Secondary | ICD-10-CM

## 2012-02-25 DIAGNOSIS — L0291 Cutaneous abscess, unspecified: Secondary | ICD-10-CM

## 2012-02-25 DIAGNOSIS — L03317 Cellulitis of buttock: Secondary | ICD-10-CM | POA: Diagnosis present

## 2012-02-25 DIAGNOSIS — IMO0002 Reserved for concepts with insufficient information to code with codable children: Secondary | ICD-10-CM | POA: Diagnosis present

## 2012-02-25 LAB — CBC WITH DIFFERENTIAL/PLATELET
Basophils Absolute: 0 10*3/uL (ref 0.0–0.1)
Basophils Relative: 0 % (ref 0–1)
Eosinophils Relative: 2 % (ref 0–5)
HCT: 38.9 % (ref 36.0–46.0)
MCH: 30.2 pg (ref 26.0–34.0)
MCHC: 35 g/dL (ref 30.0–36.0)
MCV: 86.3 fL (ref 78.0–100.0)
Monocytes Absolute: 0.9 10*3/uL (ref 0.1–1.0)
RDW: 12.3 % (ref 11.5–15.5)

## 2012-02-25 LAB — GLUCOSE, CAPILLARY
Glucose-Capillary: 267 mg/dL — ABNORMAL HIGH (ref 70–99)
Glucose-Capillary: 204 mg/dL — ABNORMAL HIGH (ref 70–99)
Glucose-Capillary: 161 mg/dL — ABNORMAL HIGH (ref 70–99)
Glucose-Capillary: 242 mg/dL — ABNORMAL HIGH (ref 70–99)
Glucose-Capillary: 247 mg/dL — ABNORMAL HIGH (ref 70–99)

## 2012-02-25 LAB — URINALYSIS, ROUTINE W REFLEX MICROSCOPIC
Bilirubin Urine: NEGATIVE
Protein, ur: NEGATIVE mg/dL
Specific Gravity, Urine: 1.02 (ref 1.005–1.030)
Urobilinogen, UA: 0.2 mg/dL (ref 0.0–1.0)

## 2012-02-25 LAB — COMPREHENSIVE METABOLIC PANEL
ALT: 11 U/L (ref 0–35)
AST: 10 U/L (ref 0–37)
Albumin: 3.2 g/dL — ABNORMAL LOW (ref 3.5–5.2)
Alkaline Phosphatase: 99 U/L (ref 39–117)
GFR calc Af Amer: >90 mL/min (ref >90)
Glucose, Bld: 218 mg/dL — ABNORMAL HIGH (ref 70–99)
Potassium: 3.5 mEq/L (ref 3.5–5.1)
Sodium: 137 mEq/L (ref 135–145)
Total Protein: 6.7 g/dL (ref 6.0–8.3)

## 2012-02-25 LAB — URINE MICROSCOPIC-ADD ON

## 2012-02-25 MED ORDER — OXYCODONE-ACETAMINOPHEN 5-325 MG PO TABS
2.0000 | ORAL_TABLET | ORAL | Status: DC | PRN
  Administered 2012-02-25 – 2012-02-29 (×9): 2 via ORAL
  Filled 2012-02-25 (×10): qty 2

## 2012-02-25 MED ORDER — HYDROMORPHONE HCL PF 1 MG/ML IJ SOLN
1.0000 mg | INTRAMUSCULAR | Status: DC | PRN
  Administered 2012-02-25 – 2012-02-28 (×14): 1 mg via INTRAVENOUS
  Filled 2012-02-25 (×14): qty 1

## 2012-02-25 MED ORDER — DEXTROSE 5 % IV SOLN
1.0000 g | Freq: Two times a day (BID) | INTRAVENOUS | Status: DC
  Administered 2012-02-25 – 2012-02-28 (×7): 1 g via INTRAVENOUS
  Filled 2012-02-25 (×10): qty 1

## 2012-02-25 MED ORDER — NORTRIPTYLINE HCL 25 MG PO CAPS
100.0000 mg | ORAL_CAPSULE | Freq: Every day | ORAL | Status: DC
  Administered 2012-02-25 – 2012-02-28 (×4): 100 mg via ORAL
  Filled 2012-02-25 (×5): qty 4

## 2012-02-25 MED ORDER — BUPROPION HCL ER (XL) 150 MG PO TB24
150.0000 mg | ORAL_TABLET | Freq: Every day | ORAL | Status: DC
  Administered 2012-02-25 – 2012-02-29 (×5): 150 mg via ORAL
  Filled 2012-02-25 (×5): qty 1

## 2012-02-25 MED ORDER — INSULIN GLARGINE 100 UNIT/ML ~~LOC~~ SOLN
30.0000 [IU] | Freq: Every day | SUBCUTANEOUS | Status: DC
  Administered 2012-02-25 – 2012-02-28 (×4): 30 [IU] via SUBCUTANEOUS

## 2012-02-25 MED ORDER — NORTRIPTYLINE HCL 25 MG PO CAPS: 100.0000 mg | ORAL_CAPSULE | Freq: Every day | ORAL | Status: DC

## 2012-02-25 MED ORDER — CITALOPRAM HYDROBROMIDE 40 MG PO TABS
40.0000 mg | ORAL_TABLET | Freq: Every day | ORAL | Status: DC
  Administered 2012-02-25 – 2012-02-29 (×5): 40 mg via ORAL
  Filled 2012-02-25 (×5): qty 1

## 2012-02-25 MED ORDER — LIVING WELL WITH DIABETES BOOK
Freq: Once | Status: DC
  Filled 2012-02-25: qty 1

## 2012-02-25 MED ORDER — INSULIN GLARGINE 100 UNIT/ML ~~LOC~~ SOLN: 25.0000 [IU] | Freq: Every day | SUBCUTANEOUS | Status: DC

## 2012-02-25 MED ORDER — ACETAMINOPHEN 650 MG RE SUPP: 650.0000 mg | Freq: Four times a day (QID) | RECTAL | Status: DC | PRN

## 2012-02-25 MED ORDER — SODIUM CHLORIDE 0.9 % IV SOLN
INTRAVENOUS | Status: DC
  Administered 2012-02-25 – 2012-02-27 (×4): via INTRAVENOUS

## 2012-02-25 MED ORDER — IOHEXOL 300 MG/ML  SOLN
100.0000 mL | Freq: Once | INTRAMUSCULAR | Status: AC | PRN
  Administered 2012-02-25: 100 mL via INTRAVENOUS

## 2012-02-25 MED ORDER — HYDROMORPHONE HCL PF 1 MG/ML IJ SOLN
1.0000 mg | Freq: Once | INTRAMUSCULAR | Status: AC
  Administered 2012-02-25: 1 mg via INTRAVENOUS
  Filled 2012-02-25: qty 1

## 2012-02-25 MED ORDER — ONDANSETRON HCL 4 MG PO TABS: 4.0000 mg | ORAL_TABLET | Freq: Four times a day (QID) | ORAL | Status: DC | PRN

## 2012-02-25 MED ORDER — ONDANSETRON HCL 4 MG/2ML IJ SOLN: 4.0000 mg | Freq: Four times a day (QID) | INTRAMUSCULAR | Status: DC | PRN

## 2012-02-25 MED ORDER — INSULIN ASPART 100 UNIT/ML ~~LOC~~ SOLN
0.0000 [IU] | Freq: Three times a day (TID) | SUBCUTANEOUS | Status: DC
  Administered 2012-02-25 (×3): 5 [IU] via SUBCUTANEOUS
  Administered 2012-02-26 (×2): 3 [IU] via SUBCUTANEOUS

## 2012-02-25 MED ORDER — ACETAMINOPHEN 325 MG PO TABS
650.0000 mg | ORAL_TABLET | Freq: Four times a day (QID) | ORAL | Status: DC | PRN
  Administered 2012-02-27: 650 mg via ORAL
  Filled 2012-02-25 (×2): qty 2

## 2012-02-25 MED ORDER — VANCOMYCIN HCL IN DEXTROSE 1-5 GM/200ML-% IV SOLN
1000.0000 mg | Freq: Two times a day (BID) | INTRAVENOUS | Status: DC
  Administered 2012-02-25 – 2012-02-26 (×3): 1000 mg via INTRAVENOUS
  Filled 2012-02-25 (×3): qty 200

## 2012-02-25 NOTE — Progress Notes (Signed)
Inpatient Diabetes Program Recommendations  AACE/ADA: New Consensus Statement on Inpatient Glycemic Control (2013)  Target Ranges:  Prepandial:   less than 140 mg/dL      Peak postprandial:   less than 180 mg/dL (1-2 hours)      Critically ill patients:  140 - 180 mg/dL   Reason for Visit: Diabetes Consult 41 year old woman with uncontrolled diabetes mellitus who presents with a gluteal abscess and cellulitis. An absess was first noticed 4 days ago. Yesterday she was evaluated by her PCP and he instructed her to come to the ED for an I&D. This was performed by Dr. Weldon Inches, and at the time there was no concern for surrounding cellulitis. She represented to her PCP today, complaining of increased erythema, tenderness, and purulence. He again advised her to come to the ED for evaluation and treatment. She reports a fever of 101 at the PCP where she was given 800mg  of ibuprofen. Here she is afebrile. She is complaining of painful defecation but no hematochezia. She endorses dysuria but no hematuria. She endorses nausea but no vomiting. The pain associated with this now extends into her lower abdomen.  Pt has hx gestational diabetes.  Sees PCP for diabetes management and has recently been on Lantus 25 units QHS and metformin 100 mg bid.  Checks blood sugars at home. For I and D tomorrow am.  Results for [name], [name] (MRN [id number]) as of 02/25/2012 17:10  Ref. Range 02/24/2012 23:04 02/25/2012 06:45  Glucose Latest Range: 70-99 mg/dL 086 (H) 578 (H)  Results for [name], [name] (MRN [id number]) as of 02/25/2012 17:10  Ref. Range 02/24/2012 19:27 02/25/2012 00:32 02/25/2012 01:49 02/25/2012 02:28 02/25/2012 02:58 02/25/2012 04:58 02/25/2012 08:03 02/25/2012 12:17  Glucose-Capillary Latest Range: 70-99 mg/dL 413 (H) 244 (H) 010 (H) 263 (H) 204 (H) 161 (H) 224 (H) 222 (H)   Inpatient Diabetes Program Recommendations Correction (SSI): Add HS coverage Insulin - Meal Coverage: Will probably need meal coverage insulin -  Novolog 4 units tidwc HgbA1C: Needs HgbA1C to assess glycemic control prior to admission. Pt states last one, approx 6 mo ago was 11.  Note: Pt asked good questions regarding diet and medication to control blood sugars.  Encouraged pt to view diabetes videos on pt ed channel and will order Living Well with Diabetes book from pharmacy.  Will probably need to go home on basal/bolus insulin, along with OHAs.  Will follow tomorrow morning.

## 2012-02-25 NOTE — ED Notes (Signed)
Pt is back in room from radiology 

## 2012-02-25 NOTE — ED Notes (Signed)
Stop insulin drip and Dextrose %%-0.45NS per EDP

## 2012-02-25 NOTE — Progress Notes (Signed)
ANTIBIOTIC CONSULT NOTE - INITIAL  Pharmacy Consult for vancomycin/cefepime Indication: cellulitis  No Known Allergies  Patient Measurements: Height: 5\' 6"  (167.6 cm) Weight: 173 lb (78.472 kg) IBW/kg (Calculated) : 59.3   Vital Signs: Temp: 98.1 F (36.7 C) (08/07 0620) Temp src: Oral (08/06 2259) BP: 121/75 mmHg (08/07 0620) Pulse Rate: 86  (08/07 0620)  Labs:  Basename 02/24/12 2304  WBC 9.8  HGB 14.3  PLT 264  LABCREA --  CREATININE 0.49*   Estimated Creatinine Clearance: 98.9 ml/min (by C-G formula based on Cr of 0.49).  Microbiology: No results found for this or any previous visit (from the past 720 hour(s)).  Medical History: Past Medical History  Diagnosis Date  . Diabetes mellitus   . Depression     Medications:  Prescriptions prior to admission  Medication Sig Dispense Refill  . buPROPion (WELLBUTRIN XL) 150 MG 24 hr tablet Take 150 mg by mouth daily.      . cephALEXin (KEFLEX) 500 MG capsule Take 1 capsule (500 mg total) by mouth 4 (four) times daily.  40 capsule  0  . citalopram (CELEXA) 40 MG tablet Take 40 mg by mouth daily.      . insulin glargine (LANTUS) 100 UNIT/ML injection Inject 25 Units into the skin at bedtime.      . metFORMIN (GLUCOPHAGE) 1000 MG tablet Take 1,000 mg by mouth 2 (two) times daily with a meal.      . nortriptyline (PAMELOR) 50 MG capsule Take 100 mg by mouth at bedtime.      Marland Kitchen oxyCODONE-acetaminophen (PERCOCET/ROXICET) 5-325 MG per tablet Take 2 tablets by mouth every 4 (four) hours as needed for pain.  6 tablet  0   Scheduled:    . buPROPion  150 mg Oral Daily  . ceFEPime (MAXIPIME) IV  1 g Intravenous Q12H  . citalopram  40 mg Oral Daily  .  HYDROmorphone (DILAUDID) injection  1 mg Intravenous Once  . insulin aspart  0-15 Units Subcutaneous TID WC  . insulin glargine  25 Units Subcutaneous QHS  . insulin (NOVOLIN-R) infusion   Intravenous To ER  .  morphine injection  2 mg Intravenous Once  . nortriptyline  100 mg  Oral QHS  . vancomycin  1,000 mg Intravenous Once  . vancomycin  1,000 mg Intravenous Q12H  . DISCONTD: sodium chloride   Intravenous Once  . DISCONTD: nortriptyline  100 mg Oral QHS    Assessment: 41yo female presented to ED for small pimple-like lesion on buttock, had I&D in ED and discharged on PO ABX, returned to ED when pain and discharge worsened, now to be admitted for IV ABX for cellulitis.  Goal of Therapy:  Vancomycin trough level 10-15 mcg/ml  Plan:  Rec'd vanc 1g in ED; will continue with vancomycin 1000mg  IV Q12H as well as cefepime 1g IV Q12H and monitor CBC, Cx, levels prn.  Colleen Can PharmD BCPS 02/25/2012,6:37 AM

## 2012-02-25 NOTE — H&P (Signed)
[name] is an 41 y.o. female.   The patient was seen and examined on February 25, 2012 at 5:52 AM. PCP - Ms. Arletha Grippe at [facility]. Chief Complaint: Buttock cellulitis. HPI: 41 year-old female with history of diabetes mellitus2 present with complaints of worsening discharge from the left buttock area. Patient had a small pimple-like lesion on the left buttock area 3 days ago which slowly worsened and got more painful erythematous. Patient had come to the ER night before this and got it incised and drained. And was discharged on oral antibiotics and pain relief medications. Pain and discharge worsened after going home. By evening patient presented back to the ER. CT pelvis was done which shows cellulitis. Patient has been admitted for IV antibiotics. Surgeon on call was consulted by ER physician and will be seeing patient in consult.  Past Medical History  Diagnosis Date  . Diabetes mellitus   . Depression     Past Surgical History  Procedure Date  . Cesarean section 2010  . Breast lumpectomy   . Patella release and manipulation   . Tonsillectomy     Family History  Problem Relation Age of Onset  . Hypertension Mother   . Lung cancer Mother    Social History:  reports that she has never smoked. She does not have any smokeless tobacco history on file. She reports that she drinks alcohol. She reports that she does not use illicit drugs.  Allergies: No Known Allergies   (Not in a hospital admission)  Results for orders placed during the hospital encounter of 02/24/12 (from the past 48 hour(s))  GLUCOSE, CAPILLARY     Status: Abnormal   Collection Time   02/24/12  7:27 PM      Component Value Range Comment   Glucose-Capillary 493 (*) 70 - 99 mg/dL    Comment 1 Documented in Chart      Comment 2 Notify RN     BASIC METABOLIC PANEL     Status: Abnormal   Collection Time   02/24/12 11:04 PM      Component Value Range Comment   Sodium 137  135 - 145 mEq/L    Potassium  4.4  3.5 - 5.1 mEq/L    Chloride 98  96 - 112 mEq/L    CO2 26  19 - 32 mEq/L    Glucose, Bld 340 (*) 70 - 99 mg/dL    BUN 7  6 - 23 mg/dL    Creatinine, Ser 1.61 (*) 0.50 - 1.10 mg/dL    Calcium 9.5  8.4 - 09.6 mg/dL    GFR calc non Af Amer >90  >90 mL/min    GFR calc Af Amer >90  >90 mL/min   CBC WITH DIFFERENTIAL     Status: Abnormal   Collection Time   02/24/12 11:04 PM      Component Value Range Comment   WBC 9.8  4.0 - 10.5 K/uL    RBC 4.64  3.87 - 5.11 MIL/uL    Hemoglobin 14.3  12.0 - 15.0 g/dL    HCT 04.5  40.9 - 81.1 %    MCV 85.1  78.0 - 100.0 fL    MCH 30.8  26.0 - 34.0 pg    MCHC 36.2 (*) 30.0 - 36.0 g/dL    RDW 91.4  78.2 - 95.6 %    Platelets 264  150 - 400 K/uL    Neutrophils Relative 68  43 - 77 %  Neutro Abs 6.6  1.7 - 7.7 K/uL    Lymphocytes Relative 23  12 - 46 %    Lymphs Abs 2.2  0.7 - 4.0 K/uL    Monocytes Relative 8  3 - 12 %    Monocytes Absolute 0.7  0.1 - 1.0 K/uL    Eosinophils Relative 2  0 - 5 %    Eosinophils Absolute 0.2  0.0 - 0.7 K/uL    Basophils Relative 0  0 - 1 %    Basophils Absolute 0.0  0.0 - 0.1 K/uL   URINALYSIS, ROUTINE W REFLEX MICROSCOPIC     Status: Abnormal   Collection Time   02/25/12 12:10 AM      Component Value Range Comment   Color, Urine YELLOW  YELLOW    APPearance CLOUDY (*) CLEAR    Specific Gravity, Urine 1.020  1.005 - 1.030    pH 5.5  5.0 - 8.0    Glucose, UA >1000 (*) NEGATIVE mg/dL    Hgb urine dipstick TRACE (*) NEGATIVE    Bilirubin Urine NEGATIVE  NEGATIVE    Ketones, ur >80 (*) NEGATIVE mg/dL    Protein, ur NEGATIVE  NEGATIVE mg/dL    Urobilinogen, UA 0.2  0.0 - 1.0 mg/dL    Nitrite NEGATIVE  NEGATIVE    Leukocytes, UA TRACE (*) NEGATIVE   URINE MICROSCOPIC-ADD ON     Status: Abnormal   Collection Time   02/25/12 12:10 AM      Component Value Range Comment   Squamous Epithelial / LPF MANY (*) RARE    WBC, UA 7-10  <3 WBC/hpf    RBC / HPF 0-2  <3 RBC/hpf    Bacteria, UA RARE  RARE   GLUCOSE, CAPILLARY      Status: Abnormal   Collection Time   02/25/12 12:32 AM      Component Value Range Comment   Glucose-Capillary 281 (*) 70 - 99 mg/dL   GLUCOSE, CAPILLARY     Status: Abnormal   Collection Time   02/25/12  1:49 AM      Component Value Range Comment   Glucose-Capillary 267 (*) 70 - 99 mg/dL   GLUCOSE, CAPILLARY     Status: Abnormal   Collection Time   02/25/12  2:28 AM      Component Value Range Comment   Glucose-Capillary 263 (*) 70 - 99 mg/dL   GLUCOSE, CAPILLARY     Status: Abnormal   Collection Time   02/25/12  2:58 AM      Component Value Range Comment   Glucose-Capillary 204 (*) 70 - 99 mg/dL   GLUCOSE, CAPILLARY     Status: Abnormal   Collection Time   02/25/12  4:58 AM      Component Value Range Comment   Glucose-Capillary 161 (*) 70 - 99 mg/dL    Ct Pelvis W Contrast  02/25/2012  *RADIOLOGY REPORT*  Clinical Data:  But the bite to the inner thigh over the weekend now with increasing redness, swelling, and tenderness.  CT PELVIS WITH CONTRAST  Technique:  Multidetector CT imaging of the pelvis was performed using the standard protocol following the bolus administration of intravenous contrast.  Contrast: OMNIPAQUE IOHEXOL 300 MG/ML  SOLN  Comparison:   None.  Findings:  There is infiltration or edema in the subcutaneous fat over the left peroneal  region extending to the inferior left gluteal regions.  A tiny gas bubble is identified anteriorly.  No discrete fluid collection to suggest  abscess.  Mild prominence of lymph nodes in the groin bilaterally may represent reactive nodes. Pelvic organs appear unremarkable.  Surgical clips consistent with tubal ligations.  Bones appear intact.  No evidence of focal bone erosion or sclerosis.  IMPRESSION: Left medial peroneal and inferior gluteal subcutaneous fatty infiltration suggesting cellulitis.  Suggestion of a tiny gas bubble focally.  No fluid collection to suggest abscess.  No fistulas demonstrated.  Original Report Authenticated By:  Marlon Pel, M.D.    Review of Systems  Constitutional: Positive for fever and chills.  HENT: Negative.   Eyes: Negative.   Respiratory: Negative.   Cardiovascular: Negative.   Gastrointestinal: Negative.   Genitourinary: Negative.   Musculoskeletal:       Pain and swelling of the buttock area.  Skin: Negative.   Neurological: Negative.   Endo/Heme/Allergies: Negative.   Psychiatric/Behavioral: Negative.     Blood pressure 108/64, pulse 80, temperature 99.5 F (37.5 C), temperature source Oral, resp. rate 16, last menstrual period 02/10/2012, SpO2 98.00%. Physical Exam  Constitutional: She is oriented to person, place, and time. She appears well-developed and well-nourished. No distress.  HENT:  Head: Normocephalic and atraumatic.  Right Ear: External ear normal.  Left Ear: External ear normal.  Nose: Nose normal.  Mouth/Throat: Oropharynx is clear and moist. No oropharyngeal exudate.  Eyes: Conjunctivae are normal. Pupils are equal, round, and reactive to light. Right eye exhibits no discharge. Left eye exhibits no discharge. No scleral icterus.  Neck: Normal range of motion. Neck supple.  Cardiovascular: Normal rate and regular rhythm.   Respiratory: Effort normal and breath sounds normal. No respiratory distress. She has no wheezes. She has no rales.  GI: Soft. Bowel sounds are normal. She exhibits no distension. There is no tenderness. There is no rebound.       Large area encompassing the left buttock area with erythema and induration. Incisional area seen.There is a small area on the right buttock area.  Musculoskeletal: Normal range of motion. She exhibits no edema and no tenderness.  Neurological: She is alert and oriented to person, place, and time.       Moves all extremities.  Skin: She is not diaphoretic.  Psychiatric: Her behavior is normal.     Assessment/Plan #1. Cellulitis of the buttock area - patient has been started on IV antibiotics vancomycin and  cefepime. Get wound cultures. Surgery to see. #2. Uncontrolled diabetes but not in DKA - patient was initially started on IV insulin infusion. Her blood sugar has decreased to less than 250 now. Patient will be placed on moderate dose sliding scale with her regular home dose of insulin Lantus. Patient states it has been increasing getting difficult to control her sugars. Check hemoglobin A1c. #3. History of depression - continue present medications.  CODE STATUS - full code.  Imara Standiford N. 02/25/2012, 5:52 AM

## 2012-02-25 NOTE — Progress Notes (Signed)
Left buttocks/perineal abscess.  Inadequately drained.    Patient had bad experience with local anesthesia for I&D.  So, I will schedule an I&D in the OR tomorrow wider drainage of the abscess.  Discussed with patient and grand mother (in law).  Ovidio Kin, MD, [facility] Pager: [phone] Office phone:  [phone]

## 2012-02-25 NOTE — Consult Note (Signed)
Reason for Consult: Cellulitis of the L groin with draining abscess Referring Physician: Sunnie Nielsen, MD  [name] is an 41 y.o. female. She presented to the ED with an abscess on her L Groin.  An I&D was performed in the ED.  She returned yesterday evening for fevers and was admitted to the hospital.  She has been placed on IV abx.  We were consulted to evaluate for further treatment.  She is a diabetic, and her blood glucose levels have been high over the last few days.  She is currently on Cefepime.    Past Medical History  Diagnosis Date  . Diabetes mellitus   . Depression    Past Surgical History  Procedure Date  . Cesarean section 2010  . Breast lumpectomy   . Patella release and manipulation   . Tonsillectomy    Family History  Problem Relation Age of Onset  . Hypertension Mother   . Lung cancer Mother    Social History:  reports that she has never smoked. She does not have any smokeless tobacco history on file. She reports that she drinks alcohol. She reports that she does not use illicit drugs.  Allergies: No Known Allergies  Medications: I have reviewed the patient's current medications.  Results for orders placed during the hospital encounter of 02/24/12 (from the past 48 hour(s))  GLUCOSE, CAPILLARY     Status: Abnormal   Collection Time   02/24/12  7:27 PM      Component Value Range Comment   Glucose-Capillary 493 (*) 70 - 99 mg/dL    Comment 1 Documented in Chart      Comment 2 Notify RN     BASIC METABOLIC PANEL     Status: Abnormal   Collection Time   02/24/12 11:04 PM      Component Value Range Comment   Sodium 137  135 - 145 mEq/L    Potassium 4.4  3.5 - 5.1 mEq/L    Chloride 98  96 - 112 mEq/L    CO2 26  19 - 32 mEq/L    Glucose, Bld 340 (*) 70 - 99 mg/dL    BUN 7  6 - 23 mg/dL    Creatinine, Ser 4.09 (*) 0.50 - 1.10 mg/dL    Calcium 9.5  8.4 - 81.1 mg/dL    GFR calc non Af Amer >90  >90 mL/min    GFR calc Af Amer >90  >90 mL/min   CBC WITH  DIFFERENTIAL     Status: Abnormal   Collection Time   02/24/12 11:04 PM      Component Value Range Comment   WBC 9.8  4.0 - 10.5 K/uL    RBC 4.64  3.87 - 5.11 MIL/uL    Hemoglobin 14.3  12.0 - 15.0 g/dL    HCT 91.4  78.2 - 95.6 %    MCV 85.1  78.0 - 100.0 fL    MCH 30.8  26.0 - 34.0 pg    MCHC 36.2 (*) 30.0 - 36.0 g/dL    RDW 21.3  08.6 - 57.8 %    Platelets 264  150 - 400 K/uL    Neutrophils Relative 68  43 - 77 %    Neutro Abs 6.6  1.7 - 7.7 K/uL    Lymphocytes Relative 23  12 - 46 %    Lymphs Abs 2.2  0.7 - 4.0 K/uL    Monocytes Relative 8  3 - 12 %    Monocytes  Absolute 0.7  0.1 - 1.0 K/uL    Eosinophils Relative 2  0 - 5 %    Eosinophils Absolute 0.2  0.0 - 0.7 K/uL    Basophils Relative 0  0 - 1 %    Basophils Absolute 0.0  0.0 - 0.1 K/uL   URINALYSIS, ROUTINE W REFLEX MICROSCOPIC     Status: Abnormal   Collection Time   02/25/12 12:10 AM      Component Value Range Comment   Color, Urine YELLOW  YELLOW    APPearance CLOUDY (*) CLEAR    Specific Gravity, Urine 1.020  1.005 - 1.030    pH 5.5  5.0 - 8.0    Glucose, UA >1000 (*) NEGATIVE mg/dL    Hgb urine dipstick TRACE (*) NEGATIVE    Bilirubin Urine NEGATIVE  NEGATIVE    Ketones, ur >80 (*) NEGATIVE mg/dL    Protein, ur NEGATIVE  NEGATIVE mg/dL    Urobilinogen, UA 0.2  0.0 - 1.0 mg/dL    Nitrite NEGATIVE  NEGATIVE    Leukocytes, UA TRACE (*) NEGATIVE   URINE MICROSCOPIC-ADD ON     Status: Abnormal   Collection Time   02/25/12 12:10 AM      Component Value Range Comment   Squamous Epithelial / LPF MANY (*) RARE    WBC, UA 7-10  <3 WBC/hpf    RBC / HPF 0-2  <3 RBC/hpf    Bacteria, UA RARE  RARE   GLUCOSE, CAPILLARY     Status: Abnormal   Collection Time   02/25/12 12:32 AM      Component Value Range Comment   Glucose-Capillary 281 (*) 70 - 99 mg/dL   GLUCOSE, CAPILLARY     Status: Abnormal   Collection Time   02/25/12  1:49 AM      Component Value Range Comment   Glucose-Capillary 267 (*) 70 - 99 mg/dL   GLUCOSE,  CAPILLARY     Status: Abnormal   Collection Time   02/25/12  2:28 AM      Component Value Range Comment   Glucose-Capillary 263 (*) 70 - 99 mg/dL   GLUCOSE, CAPILLARY     Status: Abnormal   Collection Time   02/25/12  2:58 AM      Component Value Range Comment   Glucose-Capillary 204 (*) 70 - 99 mg/dL   GLUCOSE, CAPILLARY     Status: Abnormal   Collection Time   02/25/12  4:58 AM      Component Value Range Comment   Glucose-Capillary 161 (*) 70 - 99 mg/dL   COMPREHENSIVE METABOLIC PANEL     Status: Abnormal   Collection Time   02/25/12  6:45 AM      Component Value Range Comment   Sodium 137  135 - 145 mEq/L    Potassium 3.5  3.5 - 5.1 mEq/L    Chloride 99  96 - 112 mEq/L    CO2 25  19 - 32 mEq/L    Glucose, Bld 218 (*) 70 - 99 mg/dL    BUN 8  6 - 23 mg/dL    Creatinine, Ser 1.61 (*) 0.50 - 1.10 mg/dL    Calcium 9.1  8.4 - 09.6 mg/dL    Total Protein 6.7  6.0 - 8.3 g/dL    Albumin 3.2 (*) 3.5 - 5.2 g/dL    AST 10  0 - 37 U/L    ALT 11  0 - 35 U/L    Alkaline Phosphatase 99  39 - 117 U/L    Total Bilirubin 0.3  0.3 - 1.2 mg/dL    GFR calc non Af Amer >90  >90 mL/min    GFR calc Af Amer >90  >90 mL/min   CBC WITH DIFFERENTIAL     Status: Normal   Collection Time   02/25/12  6:45 AM      Component Value Range Comment   WBC 9.8  4.0 - 10.5 K/uL    RBC 4.51  3.87 - 5.11 MIL/uL    Hemoglobin 13.6  12.0 - 15.0 g/dL    HCT 14.7  82.9 - 56.2 %    MCV 86.3  78.0 - 100.0 fL    MCH 30.2  26.0 - 34.0 pg    MCHC 35.0  30.0 - 36.0 g/dL    RDW 13.0  86.5 - 78.4 %    Platelets 234  150 - 400 K/uL    Neutrophils Relative 68  43 - 77 %    Neutro Abs 6.7  1.7 - 7.7 K/uL    Lymphocytes Relative 21  12 - 46 %    Lymphs Abs 2.0  0.7 - 4.0 K/uL    Monocytes Relative 9  3 - 12 %    Monocytes Absolute 0.9  0.1 - 1.0 K/uL    Eosinophils Relative 2  0 - 5 %    Eosinophils Absolute 0.2  0.0 - 0.7 K/uL    Basophils Relative 0  0 - 1 %    Basophils Absolute 0.0  0.0 - 0.1 K/uL   GLUCOSE, CAPILLARY      Status: Abnormal   Collection Time   02/25/12  8:03 AM      Component Value Range Comment   Glucose-Capillary 224 (*) 70 - 99 mg/dL     Ct Pelvis W Contrast  02/25/2012  *RADIOLOGY REPORT*  Clinical Data:  But the bite to the inner thigh over the weekend now with increasing redness, swelling, and tenderness.  CT PELVIS WITH CONTRAST  Technique:  Multidetector CT imaging of the pelvis was performed using the standard protocol following the bolus administration of intravenous contrast.  Contrast: OMNIPAQUE IOHEXOL 300 MG/ML  SOLN  Comparison:   None.  Findings:  There is infiltration or edema in the subcutaneous fat over the left peroneal  region extending to the inferior left gluteal regions.  A tiny gas bubble is identified anteriorly.  No discrete fluid collection to suggest abscess.  Mild prominence of lymph nodes in the groin bilaterally may represent reactive nodes. Pelvic organs appear unremarkable.  Surgical clips consistent with tubal ligations.  Bones appear intact.  No evidence of focal bone erosion or sclerosis.  IMPRESSION: Left medial peroneal and inferior gluteal subcutaneous fatty infiltration suggesting cellulitis.  Suggestion of a tiny gas bubble focally.  No fluid collection to suggest abscess.  No fistulas demonstrated.  Original Report Authenticated By: Marlon Pel, M.D.   Review of Systems  Constitutional: Positive for fever. Negative for chills.  Respiratory: Negative for cough and shortness of breath.   Cardiovascular: Negative for chest pain and palpitations.  Gastrointestinal: Negative for nausea, vomiting and abdominal pain.  Genitourinary: Negative for dysuria.  Skin: Negative for itching.   Blood pressure 121/75, pulse 86, temperature 98.1 F (36.7 C), temperature source Oral, resp. rate 18, height 5\' 6"  (1.676 m), weight 173 lb (78.472 kg), last menstrual period 02/10/2012, SpO2 100.00%. Physical Exam Gen: No apparent distress Chest: CTA bilaterally CV:  RRR Abd: soft, nontender,  nondistended Incision noted just lateral to L labia, purulence expressed upon palpation.  Mild L groin lymphadenopathy, erythema from superior aspect of the L labia to the level of the anterior aspect of the anus, cellulitis noted throughout this area  Assessment/Plan: May need additional drainage Would cont IV abx today and work on getting blood glucose under control today NPO after Midnight- If area has not improved significantly, may need additional drainage in the OR tom am  Kimberly Nieland C. 02/25/2012, 9:32 AM

## 2012-02-25 NOTE — Care Management Note (Signed)
  Page 1 of 1   02/25/2012     8:30:27 AM   CARE MANAGEMENT NOTE 02/25/2012  Patient:  [name], [name]   Account Number:  [id number]  Date Initiated:  02/25/2012  Documentation initiated by:  Ronny Flurry  Subjective/Objective Assessment:   Cellulitis and abscess     Action/Plan:   Anticipated DC Date:  02/27/2012   Anticipated DC Plan:  HOME/SELF CARE         Choice offered to / List presented to:             Status of service:   Medicare Important Message given?   (If response is "NO", the following Medicare IM given date fields will be blank) Date Medicare IM given:   Date Additional Medicare IM given:    Discharge Disposition:    Per UR Regulation:  Reviewed for med. necessity/level of care/duration of stay  If discussed at Long Length of Stay Meetings, dates discussed:    Comments:

## 2012-02-25 NOTE — ED Provider Notes (Signed)
Results for orders placed during the hospital encounter of 02/24/12  GLUCOSE, CAPILLARY      Component Value Range   Glucose-Capillary 493 (*) 70 - 99 mg/dL   Comment 1 Documented in Chart     Comment 2 Notify RN    BASIC METABOLIC PANEL      Component Value Range   Sodium 137  135 - 145 mEq/L   Potassium 4.4  3.5 - 5.1 mEq/L   Chloride 98  96 - 112 mEq/L   CO2 26  19 - 32 mEq/L   Glucose, Bld 340 (*) 70 - 99 mg/dL   BUN 7  6 - 23 mg/dL   Creatinine, Ser 4.09 (*) 0.50 - 1.10 mg/dL   Calcium 9.5  8.4 - 81.1 mg/dL   GFR calc non Af Amer >90  >90 mL/min   GFR calc Af Amer >90  >90 mL/min  CBC WITH DIFFERENTIAL      Component Value Range   WBC 9.8  4.0 - 10.5 K/uL   RBC 4.64  3.87 - 5.11 MIL/uL   Hemoglobin 14.3  12.0 - 15.0 g/dL   HCT 91.4  78.2 - 95.6 %   MCV 85.1  78.0 - 100.0 fL   MCH 30.8  26.0 - 34.0 pg   MCHC 36.2 (*) 30.0 - 36.0 g/dL   RDW 21.3  08.6 - 57.8 %   Platelets 264  150 - 400 K/uL   Neutrophils Relative 68  43 - 77 %   Neutro Abs 6.6  1.7 - 7.7 K/uL   Lymphocytes Relative 23  12 - 46 %   Lymphs Abs 2.2  0.7 - 4.0 K/uL   Monocytes Relative 8  3 - 12 %   Monocytes Absolute 0.7  0.1 - 1.0 K/uL   Eosinophils Relative 2  0 - 5 %   Eosinophils Absolute 0.2  0.0 - 0.7 K/uL   Basophils Relative 0  0 - 1 %   Basophils Absolute 0.0  0.0 - 0.1 K/uL  URINALYSIS, ROUTINE W REFLEX MICROSCOPIC      Component Value Range   Color, Urine YELLOW  YELLOW   APPearance CLOUDY (*) CLEAR   Specific Gravity, Urine 1.020  1.005 - 1.030   pH 5.5  5.0 - 8.0   Glucose, UA >1000 (*) NEGATIVE mg/dL   Hgb urine dipstick TRACE (*) NEGATIVE   Bilirubin Urine NEGATIVE  NEGATIVE   Ketones, ur >80 (*) NEGATIVE mg/dL   Protein, ur NEGATIVE  NEGATIVE mg/dL   Urobilinogen, UA 0.2  0.0 - 1.0 mg/dL   Nitrite NEGATIVE  NEGATIVE   Leukocytes, UA TRACE (*) NEGATIVE  GLUCOSE, CAPILLARY      Component Value Range   Glucose-Capillary 281 (*) 70 - 99 mg/dL  URINE MICROSCOPIC-ADD ON   Component Value Range   Squamous Epithelial / LPF MANY (*) RARE   WBC, UA 7-10  <3 WBC/hpf   RBC / HPF 0-2  <3 RBC/hpf   Bacteria, UA RARE  RARE  GLUCOSE, CAPILLARY      Component Value Range   Glucose-Capillary 267 (*) 70 - 99 mg/dL  GLUCOSE, CAPILLARY      Component Value Range   Glucose-Capillary 263 (*) 70 - 99 mg/dL  GLUCOSE, CAPILLARY      Component Value Range   Glucose-Capillary 204 (*) 70 - 99 mg/dL   Ct Pelvis W Contrast  02/25/2012  *RADIOLOGY REPORT*  Clinical Data:  But the bite to the inner thigh over the  weekend now with increasing redness, swelling, and tenderness.  CT PELVIS WITH CONTRAST  Technique:  Multidetector CT imaging of the pelvis was performed using the standard protocol following the bolus administration of intravenous contrast.  Contrast: OMNIPAQUE IOHEXOL 300 MG/ML  SOLN  Comparison:   None.  Findings:  There is infiltration or edema in the subcutaneous fat over the left peroneal  region extending to the inferior left gluteal regions.  A tiny gas bubble is identified anteriorly.  No discrete fluid collection to suggest abscess.  Mild prominence of lymph nodes in the groin bilaterally may represent reactive nodes. Pelvic organs appear unremarkable.  Surgical clips consistent with tubal ligations.  Bones appear intact.  No evidence of focal bone erosion or sclerosis.  IMPRESSION: Left medial peroneal and inferior gluteal subcutaneous fatty infiltration suggesting cellulitis.  Suggestion of a tiny gas bubble focally.  No fluid collection to suggest abscess.  No fistulas demonstrated.  Original Report Authenticated By: Marlon Pel, M.D.    CT scan reviewed and discussed with general surgery on call Dr. Luisa Hart - he agrees to evaluate patient in the morning.   3:50 AM Case discussed with [facility] hospitalist on-call, DR Toniann Fail agrees to evaluation for admission IV antibiotics for cellulitis.  Sunnie Nielsen, MD 02/25/12 [id number]

## 2012-02-25 NOTE — ED Notes (Signed)
Pt is currently in radiology. 

## 2012-02-25 NOTE — Progress Notes (Signed)
[facility] PROGRESS NOTE  [name] ZOX:[id number] DOB: October 05, 1970 DOA: 02/24/2012 PCP: REDMON,NOELLE, PA  Assessment/Plan: Principal Problem:  *Cellulitis of buttock Active Problems:  DM (diabetes mellitus), type 2, uncontrolled  1. Cellulitis of the buttock: Continue antibiotics. White blood cell count has remained normal and no fever, but surgical followup is recommended wider drainage of the abscess which will be done tomorrow in the OR. 2. Diabetes mellitus: Not well-controlled, but patient states that she's been working with her PCP to improve this.  Code Status: Full code Family Communication: Discussed with patient today Disposition Plan: Home hopefully in a few days   Brief narrative: 41 year old white female with past oral history of diabetes mellitus type 2 poorly controlled presents with swelling of her left groin does that turned into large buttock abscess with drainage  Consultants:  [facility]man-Gen. surgery  Procedures:  Status post I and D in the emergency room  Antibiotics:  Cefepime and vancomycin day 2  HPI/Subjective: Patient complains of some mild nausea and overall fatigue. No pain currently.  Objective: Filed Vitals:   02/25/12 0500 02/25/12 0620 02/25/12 1001 02/25/12 1435  BP: 108/64 121/75 105/67 100/64  Pulse: 80 86 88 72  Temp:  98.1 F (36.7 C) 98.1 F (36.7 C) 98.2 F (36.8 C)  TempSrc:    Oral  Resp:  18 18 18   Height:  5\' 6"  (1.676 m)    Weight:  78.472 kg (173 lb)    SpO2: 98% 100% 99% 97%    Intake/Output Summary (Last 24 hours) at 02/25/12 1616 Last data filed at 02/25/12 1300  Gross per 24 hour  Intake    240 ml  Output    400 ml  Net   -160 ml    Exam:   General: Alert and oriented x3, no acute distress, fatigued, looks about stated age  HEENT: Normocephalic, atraumatic, extremities are slightly dry  Cardiovascular: Regular rate and rhythm, S1-S2  Lungs: Clear to auscultation bilaterally  Abdomen: Soft,  nontender, nondistended, positive bowel sounds  Genitourinary: Erythema and swelling the area of labia to anus.  Extremity: No clubbing or cyanosis or edema  Data Reviewed: Basic Metabolic Panel:  Lab 02/25/12 8119 02/24/12 2304  NA 137 137  K 3.5 4.4  CL 99 98  CO2 25 26  GLUCOSE 218* 340*  BUN 8 7  CREATININE 0.46* 0.49*  CALCIUM 9.1 9.5  MG -- --  PHOS -- --   Liver Function Tests:  Lab 02/25/12 0645  AST 10  ALT 11  ALKPHOS 99  BILITOT 0.3  PROT 6.7  ALBUMIN 3.2*   CBC:  Lab 02/25/12 0645 02/24/12 2304  WBC 9.8 9.8  NEUTROABS 6.7 6.6  HGB 13.6 14.3  HCT 38.9 39.5  MCV 86.3 85.1  PLT 234 264   CBG:  Lab 02/25/12 1217 02/25/12 0803 02/25/12 0458 02/25/12 0258 02/25/12 0228  GLUCAP 222* 224* 161* 204* 263*      Studies: Ct Pelvis W Contrast  02/25/2012  IMPRESSION: Left medial peroneal and inferior gluteal subcutaneous fatty infiltration suggesting cellulitis.  Suggestion of a tiny gas bubble focally.  No fluid collection to suggest abscess.  No fistulas demonstrated.  Original Report Authenticated By: Marlon Pel, M.D.    Scheduled Meds:   . buPROPion  150 mg Oral Daily  . ceFEPime (MAXIPIME) IV  1 g Intravenous Q12H  . citalopram  40 mg Oral Daily  .  HYDROmorphone (DILAUDID) injection  1 mg Intravenous Once  . insulin aspart  0-15 Units Subcutaneous TID WC  . insulin glargine  30 Units Subcutaneous QHS  . insulin (NOVOLIN-R) infusion   Intravenous To ER  .  morphine injection  2 mg Intravenous Once  . nortriptyline  100 mg Oral QHS  . vancomycin  1,000 mg Intravenous Once  . vancomycin  1,000 mg Intravenous Q12H  . DISCONTD: sodium chloride   Intravenous Once  . DISCONTD: insulin glargine  25 Units Subcutaneous QHS  . DISCONTD: nortriptyline  100 mg Oral QHS   Continuous Infusions:   . sodium chloride 125 mL/hr at 02/25/12 0642  . DISCONTD: sodium chloride Stopped (02/25/12 0305)  . DISCONTD: dextrose 5 % and 0.45% NaCl Stopped  (02/25/12 0419)     Time spent: 30 minutes    Hollice Espy  [facility] Pager [phone]. If 8PM-8AM, please contact night-coverage at [email or web address], password [facility] 02/25/2012, 4:16 PM  LOS: 1 day

## 2012-02-26 ENCOUNTER — Encounter (HOSPITAL_COMMUNITY)
Admission: EM | Disposition: A | Discharge status: Home / Self Care | Payer: Self-pay | Source: Home / Self Care | Attending: Internal Medicine

## 2012-02-26 ENCOUNTER — Encounter (HOSPITAL_COMMUNITY): Payer: Self-pay | Admitting: Certified Registered"

## 2012-02-26 ENCOUNTER — Inpatient Hospital Stay (HOSPITAL_COMMUNITY): Payer: Medicaid Other | Admitting: Certified Registered"

## 2012-02-26 DIAGNOSIS — L03319 Cellulitis of trunk, unspecified: Secondary | ICD-10-CM

## 2012-02-26 DIAGNOSIS — IMO0001 Reserved for inherently not codable concepts without codable children: Secondary | ICD-10-CM

## 2012-02-26 DIAGNOSIS — L02219 Cutaneous abscess of trunk, unspecified: Secondary | ICD-10-CM

## 2012-02-26 HISTORY — PX: INCISION AND DRAINAGE PERIRECTAL ABSCESS: SHX1804

## 2012-02-26 LAB — GLUCOSE, CAPILLARY
Glucose-Capillary: 174 mg/dL — ABNORMAL HIGH (ref 70–99)
Glucose-Capillary: 267 mg/dL — ABNORMAL HIGH (ref 70–99)

## 2012-02-26 LAB — URINE CULTURE

## 2012-02-26 LAB — SURGICAL PCR SCREEN
MRSA, PCR: POSITIVE — AB
Staphylococcus aureus: POSITIVE — AB

## 2012-02-26 LAB — VANCOMYCIN, TROUGH: Vancomycin Tr: <5 ug/mL — ABNORMAL LOW (ref 10.0–20.0)

## 2012-02-26 SURGERY — INCISION AND DRAINAGE, ABSCESS, PERIRECTAL
Anesthesia: General | Site: Buttocks | Laterality: Left | Wound class: Dirty or Infected

## 2012-02-26 MED ORDER — CHLORHEXIDINE GLUCONATE CLOTH 2 % EX PADS
6.0000 | MEDICATED_PAD | Freq: Every day | CUTANEOUS | Status: DC
  Administered 2012-02-27 – 2012-02-29 (×3): 6 via TOPICAL

## 2012-02-26 MED ORDER — LIDOCAINE HCL (CARDIAC) 20 MG/ML IV SOLN
INTRAVENOUS | Status: DC | PRN
  Administered 2012-02-26: 100 mg via INTRAVENOUS

## 2012-02-26 MED ORDER — INSULIN ASPART 100 UNIT/ML ~~LOC~~ SOLN
0.0000 [IU] | SUBCUTANEOUS | Status: DC
  Administered 2012-02-26 (×2): 11 [IU] via SUBCUTANEOUS
  Administered 2012-02-27 (×3): 7 [IU] via SUBCUTANEOUS
  Administered 2012-02-27 – 2012-02-28 (×4): 3 [IU] via SUBCUTANEOUS
  Administered 2012-02-28 (×2): 4 [IU] via SUBCUTANEOUS
  Administered 2012-02-28: 7 [IU] via SUBCUTANEOUS
  Administered 2012-02-29: 3 [IU] via SUBCUTANEOUS

## 2012-02-26 MED ORDER — LACTATED RINGERS IV SOLN
INTRAVENOUS | Status: DC
  Administered 2012-02-26 – 2012-02-28 (×3): via INTRAVENOUS

## 2012-02-26 MED ORDER — MIDAZOLAM HCL 5 MG/5ML IJ SOLN
INTRAMUSCULAR | Status: DC | PRN
  Administered 2012-02-26: 2 mg via INTRAVENOUS

## 2012-02-26 MED ORDER — ROCURONIUM BROMIDE 100 MG/10ML IV SOLN
INTRAVENOUS | Status: DC | PRN
  Administered 2012-02-26: 50 mg via INTRAVENOUS

## 2012-02-26 MED ORDER — 0.9 % SODIUM CHLORIDE (POUR BTL) OPTIME
TOPICAL | Status: DC | PRN
  Administered 2012-02-26: 1000 mL

## 2012-02-26 MED ORDER — ONDANSETRON HCL 4 MG/2ML IJ SOLN: 4.0000 mg | Freq: Once | INTRAMUSCULAR | Status: DC | PRN

## 2012-02-26 MED ORDER — GLYCOPYRROLATE 0.2 MG/ML IJ SOLN
INTRAMUSCULAR | Status: DC | PRN
  Administered 2012-02-26: .8 mg via INTRAVENOUS

## 2012-02-26 MED ORDER — ONDANSETRON HCL 4 MG/2ML IJ SOLN
INTRAMUSCULAR | Status: DC | PRN
  Administered 2012-02-26: 4 mg via INTRAVENOUS

## 2012-02-26 MED ORDER — LIDOCAINE HCL 4 % MT SOLN
OROMUCOSAL | Status: DC | PRN
  Administered 2012-02-26: 4 mL via TOPICAL

## 2012-02-26 MED ORDER — NEOSTIGMINE METHYLSULFATE 1 MG/ML IJ SOLN
INTRAMUSCULAR | Status: DC | PRN
  Administered 2012-02-26: 5 mg via INTRAVENOUS

## 2012-02-26 MED ORDER — MUPIROCIN 2 % EX OINT
1.0000 "application " | TOPICAL_OINTMENT | Freq: Two times a day (BID) | CUTANEOUS | Status: DC
  Administered 2012-02-26 – 2012-02-29 (×7): 1 via NASAL
  Filled 2012-02-26: qty 22

## 2012-02-26 MED ORDER — HYDROMORPHONE HCL PF 1 MG/ML IJ SOLN
INTRAMUSCULAR | Status: AC
  Administered 2012-02-26: 0.5 mg via INTRAVENOUS
  Filled 2012-02-26: qty 1

## 2012-02-26 MED ORDER — HYDROMORPHONE HCL PF 1 MG/ML IJ SOLN
0.2500 mg | INTRAMUSCULAR | Status: DC | PRN
  Administered 2012-02-26 (×2): 0.5 mg via INTRAVENOUS

## 2012-02-26 MED ORDER — VANCOMYCIN HCL IN DEXTROSE 1-5 GM/200ML-% IV SOLN
1000.0000 mg | Freq: Three times a day (TID) | INTRAVENOUS | Status: DC
  Administered 2012-02-26 – 2012-02-28 (×5): 1000 mg via INTRAVENOUS
  Filled 2012-02-26 (×7): qty 200

## 2012-02-26 MED ORDER — LACTATED RINGERS IV SOLN
INTRAVENOUS | Status: DC | PRN
  Administered 2012-02-26 (×2): via INTRAVENOUS

## 2012-02-26 MED ORDER — PROPOFOL 10 MG/ML IV EMUL
INTRAVENOUS | Status: DC | PRN
  Administered 2012-02-26: 110 mg via INTRAVENOUS

## 2012-02-26 MED ORDER — SUFENTANIL CITRATE 50 MCG/ML IV SOLN
INTRAVENOUS | Status: DC | PRN
  Administered 2012-02-26 (×2): 20 ug via INTRAVENOUS

## 2012-02-26 SURGICAL SUPPLY — 35 items
BLADE SURG 15 STRL LF DISP TIS (BLADE) ×1 IMPLANT
BLADE SURG 15 STRL SS (BLADE) ×1
CANISTER SUCTION 2500CC (MISCELLANEOUS) ×2 IMPLANT
CLEANER TIP ELECTROSURG 2X2 (MISCELLANEOUS) ×2 IMPLANT
CLOTH BEACON ORANGE TIMEOUT ST (SAFETY) ×2 IMPLANT
COVER SURGICAL LIGHT HANDLE (MISCELLANEOUS) ×2 IMPLANT
DRAPE PROXIMA HALF (DRAPES) ×2 IMPLANT
DRAPE UTILITY 15X26 W/TAPE STR (DRAPE) ×4 IMPLANT
DRSG PAD ABDOMINAL 8X10 ST (GAUZE/BANDAGES/DRESSINGS) ×2 IMPLANT
ELECT REM PT RETURN 9FT ADLT (ELECTROSURGICAL) ×2
ELECTRODE REM PT RTRN 9FT ADLT (ELECTROSURGICAL) ×1 IMPLANT
GAUZE PACKING IODOFORM 1 (PACKING) IMPLANT
GAUZE SPONGE 4X4 16PLY XRAY LF (GAUZE/BANDAGES/DRESSINGS) ×2 IMPLANT
GLOVE BIO SURGEON STRL SZ7.5 (GLOVE) ×2 IMPLANT
GLOVE BIOGEL PI IND STRL 7.5 (GLOVE) ×1 IMPLANT
GLOVE BIOGEL PI INDICATOR 7.5 (GLOVE) ×1
GLOVE SURG SIGNA 7.5 PF LTX (GLOVE) ×2 IMPLANT
GOWN STRL NON-REIN LRG LVL3 (GOWN DISPOSABLE) ×2 IMPLANT
GOWN STRL REIN XL XLG (GOWN DISPOSABLE) ×2 IMPLANT
KIT BASIN OR (CUSTOM PROCEDURE TRAY) ×2 IMPLANT
KIT ROOM TURNOVER OR (KITS) ×2 IMPLANT
NS IRRIG 1000ML POUR BTL (IV SOLUTION) ×2 IMPLANT
PACK LITHOTOMY IV (CUSTOM PROCEDURE TRAY) ×2 IMPLANT
PAD ARMBOARD 7.5X6 YLW CONV (MISCELLANEOUS) ×4 IMPLANT
PENCIL BUTTON HOLSTER BLD 10FT (ELECTRODE) ×2 IMPLANT
SPONGE GAUZE 4X4 12PLY (GAUZE/BANDAGES/DRESSINGS) ×2 IMPLANT
SWAB COLLECTION DEVICE MRSA (MISCELLANEOUS) IMPLANT
SYR BULB 3OZ (MISCELLANEOUS) ×2 IMPLANT
TOWEL OR 17X24 6PK STRL BLUE (TOWEL DISPOSABLE) IMPLANT
TOWEL OR 17X26 10 PK STRL BLUE (TOWEL DISPOSABLE) ×2 IMPLANT
TUBE ANAEROBIC SPECIMEN COL (MISCELLANEOUS) IMPLANT
TUBE CONNECTING 12X1/4 (SUCTIONS) ×2 IMPLANT
UNDERPAD 30X30 INCONTINENT (UNDERPADS AND DIAPERS) ×2 IMPLANT
WATER STERILE IRR 1000ML POUR (IV SOLUTION) ×2 IMPLANT
YANKAUER SUCT BULB TIP NO VENT (SUCTIONS) ×2 IMPLANT

## 2012-02-26 NOTE — Brief Op Note (Signed)
02/26/2012  10:51 AM  PATIENT:  [name], 41 y.o., female, MRN: [id number]  PREOP DIAGNOSIS:  LEFT PERIRECTAL BUTTOCK ABSCESS  POSTOP DIAGNOSIS:   Left perineal abscess (approx 5 cm)  PROCEDURE:   Procedure(s): IRRIGATION AND DEBRIDEMENT PERIneal ABSCESS  SURGEON:   Ovidio Kin, M.D.  ASSISTANT:   None  ANESTHESIA:   general  Aubery Lapping, MD - Anesthesiologist Ervin Knack, CRNA - CRNA  General  EBL:  minimal  ml  BLOOD ADMINISTERED: none  DRAINS: none   LOCAL MEDICATIONS USED:   none  SPECIMEN:   none  COUNTS CORRECT:  YES  INDICATIONS FOR PROCEDURE:  [name] is a 41 y.o. (DOB: Aug 24, 1970) white female whose primary care physician is REDMON,NOELLE, PA and comes for I&D of left perineal abscess   The indications and risks of the surgery were explained to the patient.  The risks include, but are not limited to, infection, bleeding, and nerve injury.  Note dictated to:  # [id number]

## 2012-02-26 NOTE — Transfer of Care (Signed)
Immediate Anesthesia Transfer of Care Note  Patient: [name]  Procedure(s) Performed: Procedure(s) (LRB): IRRIGATION AND DEBRIDEMENT PERIRECTAL ABSCESS (Left)  Patient Location: PACU  Anesthesia Type: General  Level of Consciousness: awake, alert  and patient cooperative  Airway & Oxygen Therapy: Patient Spontanous Breathing and Patient connected to face mask oxygen  Post-op Assessment: Report given to PACU RN  Post vital signs: Reviewed and stable  Complications: No apparent anesthesia complications

## 2012-02-26 NOTE — Preoperative (Signed)
Beta Blockers   Reason not to administer Beta Blockers:Not Applicable 

## 2012-02-26 NOTE — Progress Notes (Signed)
[facility] PROGRESS NOTE  [name] ZOX:[id number] DOB: Oct 16, 1970 DOA: 02/24/2012 PCP: REDMON,NOELLE, PA  Assessment/Plan: Principal Problem:  *Cellulitis of buttock Active Problems:  DM (diabetes mellitus), type 2, uncontrolled  1. Cellulitis of the buttock: Continue antibiotics. Status post drainage of abscess today in the OR. Continue antibiotics and dressing changes tomorrow. 2. Diabetes mellitus: Not well-controlled, but patient states that she's been working with her PCP to improve this. Lantus started last night and we'll aggressively watch her sugars. Also should start coming down now the abscess has been drained.  Code Status: Full code Family Communication: Discussed with patient today Disposition Plan: Home hopefully in a few days   Brief narrative: 41 year old white female with past oral history of diabetes mellitus type 2 poorly controlled presents with swelling of her left groin does that turned into large buttock abscess with drainage  Consultants:  [facility]man-Gen. surgery  Procedures:  Status post I and D in the emergency room  Antibiotics:  Cefepime and vancomycin day 3   HPI/Subjective: Feeling better. Pain is moderate at about a 6, but overall much improved. No nausea.  Objective: Filed Vitals:   02/26/12 1115 02/26/12 1119 02/26/12 1130 02/26/12 1211  BP:   117/77 114/65  Pulse: 82 81 79 68  Temp:   97.7 F (36.5 C) 98.1 F (36.7 C)  TempSrc:      Resp: 20 16 14 16   Height:      Weight:      SpO2: 100%  100% 94%    Intake/Output Summary (Last 24 hours) at 02/26/12 1452 Last data filed at 02/26/12 1045  Gross per 24 hour  Intake   3918 ml  Output    800 ml  Net   3118 ml    Exam:   General: Alert and oriented x3, no acute distress, fatigued, looks about stated age  HEENT: Normocephalic, atraumatic, extremities are slightly dry  Cardiovascular: Regular rate and rhythm, S1-S2  Lungs: Clear to auscultation  bilaterally  Abdomen: Soft, nontender, nondistended, positive bowel sounds  Extremity: No clubbing or cyanosis or edema  Data Reviewed: Basic Metabolic Panel:  Lab 02/25/12 8119 02/24/12 2304  NA 137 137  K 3.5 4.4  CL 99 98  CO2 25 26  GLUCOSE 218* 340*  BUN 8 7  CREATININE 0.46* 0.49*  CALCIUM 9.1 9.5  MG -- --  PHOS -- --   Liver Function Tests:  Lab 02/25/12 0645  AST 10  ALT 11  ALKPHOS 99  BILITOT 0.3  PROT 6.7  ALBUMIN 3.2*   CBC:  Lab 02/25/12 0645 02/24/12 2304  WBC 9.8 9.8  NEUTROABS 6.7 6.6  HGB 13.6 14.3  HCT 38.9 39.5  MCV 86.3 85.1  PLT 234 264   CBG:  Lab 02/26/12 1206 02/26/12 1101 02/26/12 0803 02/25/12 2057 02/25/12 1734  GLUCAP 174* 206* 235* 247* 242*      Studies: Ct Pelvis W Contrast  02/25/2012  IMPRESSION: Left medial peroneal and inferior gluteal subcutaneous fatty infiltration suggesting cellulitis.  Suggestion of a tiny gas bubble focally.  No fluid collection to suggest abscess.  No fistulas demonstrated.  Original Report Authenticated By: Marlon Pel, M.D.    Scheduled Meds:    . buPROPion  150 mg Oral Daily  . ceFEPime (MAXIPIME) IV  1 g Intravenous Q12H  . Chlorhexidine Gluconate Cloth  6 each Topical Q0600  . citalopram  40 mg Oral Daily  . insulin aspart  0-15 Units Subcutaneous TID WC  . insulin  glargine  30 Units Subcutaneous QHS  . living well with diabetes book   Does not apply Once  . mupirocin ointment  1 application Nasal BID  . nortriptyline  100 mg Oral QHS  . vancomycin  1,000 mg Intravenous Q8H  . DISCONTD: vancomycin  1,000 mg Intravenous Q12H   Continuous Infusions:    . sodium chloride 125 mL/hr at 02/26/12 0120  . lactated ringers 50 mL/hr at 02/26/12 1344     Time spent: 20 minutes    Hollice Espy  [facility] Pager [phone]. If 8PM-8AM, please contact night-coverage at [email or web address], password [facility] 02/26/2012, 2:52 PM  LOS: 2 days

## 2012-02-26 NOTE — Progress Notes (Signed)
Inpatient Diabetes Program Recommendations  AACE/ADA: New Consensus Statement on Inpatient Glycemic Control (2013)  Target Ranges:  Prepandial:   less than 140 mg/dL      Peak postprandial:   less than 180 mg/dL (1-2 hours)      Critically ill patients:  140 - 180 mg/dL   Reason for Visit: Hyperglycemia Results for [name], [name] (MRN [id number]) as of 02/26/2012 14:42  Ref. Range 02/25/2012 17:34 02/25/2012 20:57 02/26/2012 08:03 02/26/2012 11:01 02/26/2012 12:06  Glucose-Capillary Latest Range: 70-99 mg/dL 409 (H) 811 (H) 914 (H) 206 (H) 174 (H)  Results for VONDELL, SOWELL (MRN [id number]) as of 02/26/2012 14:42  Ref. Range 02/25/2012 13:00  Hemoglobin A1C Latest Range: <5.7 % 11.5 (H)   Discussed HgbA1C results with pt.  Had surgery this am - I & D perineal abscess.  Ate 100% at lunch and starting to read Diabetes Book.  Stressed importance of f/u with PCP and pt verbalized understanding.  Recommendations:  Add Novolog 4 units tidwc for meal coverage insulin. Increase Novolog correction to resistant tidwc and hs.  Will follow.

## 2012-02-26 NOTE — Op Note (Signed)
[name], [name]              ACCOUNT NO.:  [id number]  MEDICAL RECORD NO.:  [id number]  LOCATION:  6N30C                        FACILITY:  [facility]  PHYSICIAN:  Sandria Bales. Ezzard Standing, M.D.  DATE OF BIRTH:  April 13, 1971  DATE OF PROCEDURE:  02/26/2012                              OPERATIVE REPORT   PREOPERATIVE DIAGNOSIS:  Left perianal abscess.  POSTOPERATIVE DIAGNOSIS:  Left perianal abscess approximately 5 cm in size.  PROCEDURE:  Incision and drainage, perineal abscess.  SURGEON:  Sandria Bales. Ezzard Standing, MD  ANESTHESIA:  General endotracheal, supervised by Dr. Arta Bruce.  ESTIMATED BLOOD LOSS:  Minimal.  INDICATION FOR PROCEDURE:  [name] is a 40 year old white female who sees Noelle Redmon, [place], as her primary care provider.  She has presented with a left perineal abscess which has been incompletely drained and she now comes for further debridement and drainage of this.  Indications and potential complications of surgery were explained to the patient.  OPERATIVE NOTE:  The patient was taken to the room #1, underwent a general anesthesia supervised by Dr. Arta Bruce.  She was in lithotomy position.  Her perineum was prepped with Betadine solution.  A time-out was held and surgical checklist run.  She had about a 1 cm opening in her abscess along the left perineal area.  I probed this with a hemostat and it has a much larger cavity in the subcutaneous space.  I opened this up.  There is about a 5-cm abscess cavity which I opened up entirely.  I packed the abscess cavity with Betadine soaked gauze and sterilely dressed it.  The patient tolerated the procedure well, will start dressing changes at the bedside tomorrow.  Sandria Bales. Ezzard Standing, M.D., FACS   DHN/MEDQ  D:  02/26/2012  T:  02/26/2012  Job:  [id number]  cc:   Altamease Oiler Redmon

## 2012-02-26 NOTE — Progress Notes (Signed)
ANTIBIOTIC CONSULT NOTE - INITIAL  Pharmacy Consult for vancomycin/cefepime Indication: cellulitis  No Known Allergies  Patient Measurements: Height: 5\' 6"  (167.6 cm) Weight: 173 lb (78.472 kg) IBW/kg (Calculated) : 59.3   Vital Signs: Temp: 98.1 F (36.7 C) (08/08 1211) Temp src: Oral (08/08 0811) BP: 114/65 mmHg (08/08 1211) Pulse Rate: 68  (08/08 1211)  Labs:  Basename 02/25/12 0645 02/24/12 2304  WBC 9.8 9.8  HGB 13.6 14.3  PLT 234 264  LABCREA -- --  CREATININE 0.46* 0.49*   Estimated Creatinine Clearance: 98.9 ml/min (by C-G formula based on Cr of 0.46).  Microbiology: Recent Results (from the past 720 hour(s))  URINE CULTURE     Status: Normal   Collection Time   02/25/12 12:10 AM      Component Value Range Status Comment   Specimen Description URINE, CLEAN CATCH   Final    Special Requests NONE   Final    Culture  Setup Time 02/25/2012 00:51   Final    Colony Count 10,000 COLONIES/ML   Final    Culture     Final    Value: Multiple bacterial morphotypes present, none predominant. Suggest appropriate recollection if clinically indicated.   Report Status 02/26/2012 FINAL   Final   WOUND CULTURE     Status: Normal (Preliminary result)   Collection Time   02/25/12  6:55 AM      Component Value Range Status Comment   Specimen Description WOUND PERIRECTAL   Final    Special Requests Immunocompromised   Final    Gram Stain     Final    Value: NO WBC SEEN     NO SQUAMOUS EPITHELIAL CELLS SEEN     NO ORGANISMS SEEN   Culture     Final    Value: ABUNDANT STAPHYLOCOCCUS AUREUS     Note: RIFAMPIN AND GENTAMICIN SHOULD NOT BE USED AS SINGLE DRUGS FOR TREATMENT OF STAPH INFECTIONS.   Report Status PENDING   Incomplete   SURGICAL PCR SCREEN     Status: Abnormal   Collection Time   02/26/12  8:25 AM      Component Value Range Status Comment   MRSA, PCR POSITIVE (*) NEGATIVE Final    Staphylococcus aureus POSITIVE (*) NEGATIVE Final     Medical History: Past Medical  History  Diagnosis Date  . Diabetes mellitus   . Depression     Medications:  Prescriptions prior to admission  Medication Sig Dispense Refill  . buPROPion (WELLBUTRIN XL) 150 MG 24 hr tablet Take 150 mg by mouth daily.      . cephALEXin (KEFLEX) 500 MG capsule Take 1 capsule (500 mg total) by mouth 4 (four) times daily.  40 capsule  0  . citalopram (CELEXA) 40 MG tablet Take 40 mg by mouth daily.      . insulin glargine (LANTUS) 100 UNIT/ML injection Inject 25 Units into the skin at bedtime.      . metFORMIN (GLUCOPHAGE) 1000 MG tablet Take 1,000 mg by mouth 2 (two) times daily with a meal.      . nortriptyline (PAMELOR) 50 MG capsule Take 100 mg by mouth at bedtime.      Marland Kitchen oxyCODONE-acetaminophen (PERCOCET/ROXICET) 5-325 MG per tablet Take 2 tablets by mouth every 4 (four) hours as needed for pain.  6 tablet  0   Scheduled:     . buPROPion  150 mg Oral Daily  . ceFEPime (MAXIPIME) IV  1 g Intravenous Q12H  . Chlorhexidine  Gluconate Cloth  6 each Topical [id number]  . citalopram  40 mg Oral Daily  . insulin aspart  0-15 Units Subcutaneous TID WC  . insulin glargine  30 Units Subcutaneous QHS  . living well with diabetes book   Does not apply Once  . mupirocin ointment  1 application Nasal BID  . nortriptyline  100 mg Oral QHS  . vancomycin  1,000 mg Intravenous Q12H    Assessment: 41yo female with L buttocks/perineal abscess/cellulitis.  S/p I&D in OR today (8/8).  Vancomycin trough today reported as <5 just prior to the 4th dose of vancomycin.  Of note, cx came back with abundant SA today with MRSA PCR positive.  Vanc 8/7>> Cefepime 8/7>>  8/8 MRSA PCR: positive 8/7 wound cx: abundant SA 8/7 urine cx: mx morphotypes--recollect if needed  Goal of Therapy:  Vancomycin trough level 10-15 mcg/ml  Plan:  - Increase vancomycin to 1g IV q8h - next dose due 2000 - Continue cefepime 1g IV q12h. - F/u trough at C[facility]. - F/u SCr, UOP, cultures, clinical course.

## 2012-02-26 NOTE — Anesthesia Postprocedure Evaluation (Signed)
Anesthesia Post Note  Patient: [name]  Procedure(s) Performed: Procedure(s) (LRB): IRRIGATION AND DEBRIDEMENT PERIRECTAL ABSCESS (Left)  Anesthesia type: general  Patient location: PACU  Post pain: Pain level controlled  Post assessment: Patient's Cardiovascular Status Stable  Last Vitals:  Filed Vitals:   02/26/12 1211  BP: 114/65  Pulse: 68  Temp: 36.7 C  Resp: 16    Post vital signs: Reviewed and stable  Level of consciousness: sedated  Complications: No apparent anesthesia complications

## 2012-02-26 NOTE — Anesthesia Preprocedure Evaluation (Addendum)
Anesthesia Evaluation  Patient identified by MRN, date of birth, ID band Patient awake    Reviewed: Allergy & Precautions, H&P , NPO status , Patient's Chart, lab work & pertinent test results  Airway Mallampati: I TM Distance: >3 FB Neck ROM: Full    Dental   Pulmonary          Cardiovascular     Neuro/Psych Depression    GI/Hepatic   Endo/Other  Poorly Controlled, Type 2, Oral Hypoglycemic Agents and Insulin Dependent  Renal/GU      Musculoskeletal   Abdominal   Peds  Hematology   Anesthesia Other Findings Cellulitis of buttocks  Reproductive/Obstetrics                          Anesthesia Physical Anesthesia Plan  ASA: II  Anesthesia Plan: General   Post-op Pain Management:    Induction: Intravenous  Airway Management Planned: Oral ETT  Additional Equipment:   Intra-op Plan:   Post-operative Plan: Extubation in OR  Informed Consent: I have reviewed the patients History and Physical, chart, labs and discussed the procedure including the risks, benefits and alternatives for the proposed anesthesia with the patient or authorized representative who has indicated his/her understanding and acceptance.     Plan Discussed with: CRNA and Surgeon  Anesthesia Plan Comments:         Anesthesia Quick Evaluation

## 2012-02-26 NOTE — Anesthesia Procedure Notes (Signed)
Procedure Name: Intubation Date/Time: 02/26/2012 10:21 AM Performed by: Glendora Score A Pre-anesthesia Checklist: Patient identified, Emergency Drugs available, Suction available and Patient being monitored Patient Re-evaluated:Patient Re-evaluated prior to inductionOxygen Delivery Method: Circle system utilized Preoxygenation: Pre-oxygenation with 100% oxygen Intubation Type: IV induction Ventilation: Mask ventilation without difficulty and Oral airway inserted - appropriate to patient size Laryngoscope Size: Hyacinth Meeker and 2 Grade View: Grade I Tube type: Oral Tube size: 7.5 mm Number of attempts: 1 Airway Equipment and Method: Stylet and LTA kit utilized Placement Confirmation: ETT inserted through vocal cords under direct vision,  positive ETCO2 and breath sounds checked- equal and bilateral Secured at: 20 cm Tube secured with: Tape Dental Injury: Teeth and Oropharynx as per pre-operative assessment

## 2012-02-27 ENCOUNTER — Encounter (HOSPITAL_COMMUNITY): Payer: Self-pay | Admitting: Surgery

## 2012-02-27 DIAGNOSIS — B49 Unspecified mycosis: Secondary | ICD-10-CM

## 2012-02-27 DIAGNOSIS — B379 Candidiasis, unspecified: Secondary | ICD-10-CM | POA: Diagnosis not present

## 2012-02-27 LAB — WOUND CULTURE: Gram Stain: NONE SEEN

## 2012-02-27 LAB — GLUCOSE, CAPILLARY
Glucose-Capillary: 163 mg/dL — ABNORMAL HIGH (ref 70–99)
Glucose-Capillary: 219 mg/dL — ABNORMAL HIGH (ref 70–99)
Glucose-Capillary: 224 mg/dL — ABNORMAL HIGH (ref 70–99)

## 2012-02-27 MED ORDER — FLUCONAZOLE 150 MG PO TABS
150.0000 mg | ORAL_TABLET | Freq: Once | ORAL | Status: AC
  Administered 2012-02-27: 150 mg via ORAL
  Filled 2012-02-27: qty 1

## 2012-02-27 NOTE — Progress Notes (Signed)
[facility] PROGRESS NOTE  SAYLOR MURRY ZOX:[id number] DOB: 1970/08/21 DOA: 02/24/2012 PCP: REDMON,NOELLE, PA  Assessment/Plan: Principal Problem:  *Cellulitis of buttock Active Problems:  DM (diabetes mellitus), type 2, uncontrolled  1. Cellulitis of the buttock: status post drainage of abscess yesterday. Continue daily dressing changes and have started sitz baths. Continue antibiotics. Once pain is better managed, can discharge home. Cleared by surgery.  2. Diabetes mellitus: Not well-controlled, but patient states that she's been working with her PCP to improve this. Lantus started last night and we'll aggressively watch her sugars.CBG starting to much improved ranging from 130s to 160s.  3.Yeast infection: Give one dose of Diflucan  Code Status: Full code Family Communication: Discussed with patient today Disposition Plan: Home hopefully in a few days   Brief narrative: 41 year old white female with past oral history of diabetes mellitus type 2 poorly controlled presents with swelling of her left groin does that turned into large buttock abscess with drainage  Consultants:  [facility]. surgery  Procedures:  Status post I and D in the emergency room  Antibiotics:  Cefepime and vancomycin day 3   HPI/Subjective: Feeling better. Pain is better controlled, but still persistent. She also feels like she is developing a yeast infection.   Objective: Filed Vitals:   02/27/12 0141 02/27/12 0545 02/27/12 1035 02/27/12 1510  BP: 110/64 88/62 106/67 109/78  Pulse: 73 65 70 77  Temp: 97.8 F (36.6 C) 97.5 F (36.4 C) 97.7 F (36.5 C) 98.5 F (36.9 C)  TempSrc:   Oral Oral  Resp: 16 16 18 20   Height:      Weight:      SpO2: 98% 97% 99% 98%    Intake/Output Summary (Last 24 hours) at 02/27/12 1603 Last data filed at 02/27/12 1512  Gross per 24 hour  Intake 4398.5 ml  Output    900 ml  Net 3498.5 ml    Exam:   General: Alert and oriented x3, no acute  distress, fatigued, looks about stated age  HEENT: Normocephalic, atraumatic, extremities are slightly dry  Cardiovascular: Regular rate and rhythm, S1-S2  Lungs: Clear to auscultation bilaterally  Abdomen: Soft, nontender, nondistended, positive bowel sounds  Extremity: No clubbing or cyanosis or edema  Data Reviewed: Basic Metabolic Panel:  Lab 02/25/12 8119 02/24/12 2304  NA 137 137  K 3.5 4.4  CL 99 98  CO2 25 26  GLUCOSE 218* 340*  BUN 8 7  CREATININE 0.46* 0.49*  CALCIUM 9.1 9.5  MG -- --  PHOS -- --   Liver Function Tests:  Lab 02/25/12 0645  AST 10  ALT 11  ALKPHOS 99  BILITOT 0.3  PROT 6.7  ALBUMIN 3.2*   CBC:  Lab 02/25/12 0645 02/24/12 2304  WBC 9.8 9.8  NEUTROABS 6.7 6.6  HGB 13.6 14.3  HCT 38.9 39.5  MCV 86.3 85.1  PLT 234 264   CBG:  Lab 02/27/12 1215 02/27/12 0803 02/27/12 0401 02/27/12 0157 02/26/12 1956  GLUCAP 139* 148* 241* 197* 279*      Studies: Ct Pelvis W Contrast  02/25/2012  IMPRESSION: Left medial peroneal and inferior gluteal subcutaneous fatty infiltration suggesting cellulitis.  Suggestion of a tiny gas bubble focally.  No fluid collection to suggest abscess.  No fistulas demonstrated.  Original Report Authenticated By: Marlon Pel, M.D.    Scheduled Meds:    . buPROPion  150 mg Oral Daily  . ceFEPime (MAXIPIME) IV  1 g Intravenous Q12H  . Chlorhexidine Gluconate Cloth  6 each Topical Q0600  . citalopram  40 mg Oral Daily  . fluconazole  150 mg Oral Once  . insulin aspart  0-20 Units Subcutaneous Q4H  . insulin glargine  30 Units Subcutaneous QHS  . living well with diabetes book   Does not apply Once  . mupirocin ointment  1 application Nasal BID  . nortriptyline  100 mg Oral QHS  . vancomycin  1,000 mg Intravenous Q8H  . DISCONTD: insulin aspart  0-15 Units Subcutaneous TID WC   Continuous Infusions:    . sodium chloride 75 mL/hr at 02/27/12 1400  . lactated ringers 50 mL/hr at 02/26/12 1344      Time spent: 25 minutes    Hollice Espy  [facility] Pager [phone]. If 8PM-8AM, please contact night-coverage at [email or web address], password [facility] 02/27/2012, 4:03 PM  LOS: 3 days

## 2012-02-27 NOTE — Progress Notes (Signed)
Patient ID: [name], female   DOB: 01-21-1971, 41 y.o.   MRN: [id number] 1 Day Post-Op  Subjective: Pt feels a little better than yesterday.    Objective: Vital signs in last 24 hours: Temp:  [96.8 F (36 C)-98.3 F (36.8 C)] 97.5 F (36.4 C) (08/09 0545) Pulse Rate:  [65-86] 65  (08/09 0545) Resp:  [13-20] 16  (08/09 0545) BP: (88-129)/(62-78) 88/62 mmHg (08/09 0545) SpO2:  [94 %-100 %] 97 % (08/09 0545) Last BM Date: 02/25/12  Intake/Output from previous day: 08/08 0701 - 08/09 0700 In: 4141 [P.O.:240; I.V.:3901] Out: 500 [Urine:500] Intake/Output this shift:    PE: Buttock:  Still with some erythema and induration, but no purulent drainage.  Packing in place.  Lab Results:   Basename 02/25/12 0645 02/24/12 2304  WBC 9.8 9.8  HGB 13.6 14.3  HCT 38.9 39.5  PLT 234 264   BMET  Basename 02/25/12 0645 02/24/12 2304  NA 137 137  K 3.5 4.4  CL 99 98  CO2 25 26  GLUCOSE 218* 340*  BUN 8 7  CREATININE 0.46* 0.49*  CALCIUM 9.1 9.5   PT/INR No results found for this basename: LABPROT:2,INR:2 in the last 72 hours CMP     Component Value Date/Time   NA 137 02/25/2012 0645   K 3.5 02/25/2012 0645   CL 99 02/25/2012 0645   CO2 25 02/25/2012 0645   GLUCOSE 218* 02/25/2012 0645   BUN 8 02/25/2012 0645   CREATININE 0.46* 02/25/2012 0645   CALCIUM 9.1 02/25/2012 0645   PROT 6.7 02/25/2012 0645   ALBUMIN 3.2* 02/25/2012 0645   AST 10 02/25/2012 0645   ALT 11 02/25/2012 0645   ALKPHOS 99 02/25/2012 0645   BILITOT 0.3 02/25/2012 0645   GFRNONAA >90 02/25/2012 0645   GFRAA >90 02/25/2012 0645   Lipase  No results found for this basename: lipase       Studies/Results: No results found.  Anti-infectives: Anti-infectives     Start     Dose/Rate Route Frequency Ordered Stop   02/26/12 2000   vancomycin (VANCOCIN) IVPB 1000 mg/200 mL premix        1,000 mg 200 mL/hr over 60 Minutes Intravenous Every 8 hours 02/26/12 1345     02/25/12 1200   vancomycin (VANCOCIN) IVPB 1000 mg/200 mL  premix  Status:  Discontinued        1,000 mg 200 mL/hr over 60 Minutes Intravenous Every 12 hours 02/25/12 0632 02/26/12 1345   02/25/12 0800   ceFEPIme (MAXIPIME) 1 g in dextrose 5 % 50 mL IVPB        1 g 100 mL/hr over 30 Minutes Intravenous Every 12 hours 02/25/12 0632     02/24/12 2315   vancomycin (VANCOCIN) IVPB 1000 mg/200 mL premix        1,000 mg 200 mL/hr over 60 Minutes Intravenous  Once 02/24/12 2310 02/25/12 0035          Assessment/Plan 1. Left sided perineal abscess, s/p I&D - D. Demetreus Lothamer - 02/26/2012  From surgery standpoint, she can go home any time.  She needs sitz baths 2 to 3 times per day at home.  She needs antibiotic coverage for about one week.  And she needs to see me in my office [phone]) in about 1 to 2 weeks for wound check.  2. DM   Plan: 1. Will start dressing changes today as well as TID sitz bathes.   2. Continue abx therapy.   LOS:  3 days   OSBORNE,KELLY E 02/27/2012  Agree with above.  Ovidio Kin, MD, [facility] Pager: [phone] Office phone:  [phone]

## 2012-02-28 LAB — GLUCOSE, CAPILLARY
Glucose-Capillary: 138 mg/dL — ABNORMAL HIGH (ref 70–99)
Glucose-Capillary: 143 mg/dL — ABNORMAL HIGH (ref 70–99)
Glucose-Capillary: 87 mg/dL (ref 70–99)

## 2012-02-28 MED ORDER — OXYCODONE HCL 10 MG PO TB12
10.0000 mg | ORAL_TABLET | Freq: Two times a day (BID) | ORAL | Status: DC
  Administered 2012-02-28 – 2012-02-29 (×3): 10 mg via ORAL
  Filled 2012-02-28 (×3): qty 1

## 2012-02-28 MED ORDER — METFORMIN HCL 500 MG PO TABS
1000.0000 mg | ORAL_TABLET | Freq: Two times a day (BID) | ORAL | Status: DC
  Administered 2012-02-28 – 2012-02-29 (×2): 1000 mg via ORAL
  Filled 2012-02-28 (×4): qty 2

## 2012-02-28 MED ORDER — LEVOFLOXACIN 750 MG PO TABS
750.0000 mg | ORAL_TABLET | Freq: Every day | ORAL | Status: DC
  Administered 2012-02-28 – 2012-02-29 (×2): 750 mg via ORAL
  Filled 2012-02-28 (×2): qty 1

## 2012-02-28 NOTE — Progress Notes (Signed)
Consult made to Case management at pt request for medication assistance.

## 2012-02-28 NOTE — Progress Notes (Signed)
[facility] PROGRESS NOTE  [name]:[id number] DOB: [birth year] DOA: 02/24/2012 PCP: REDMON,NOELLE, PA  Assessment/Plan: Principal Problem:  *Cellulitis of buttock Active Problems:  DM (diabetes mellitus), type 2, uncontrolled  Yeast infection  1. Cellulitis of the buttock: status post drainage of abscess yesterday. Continue daily dressing changes and have started sitz baths. With cultures, have changed antibiotics to by mouth Levaquin. Once pain is better managed, can discharge home. Cleared by surgery. Have started extended release OxyIR to help with pain  2. Diabetes mellitus: Not well-controlled, but patient states that she's been working with her PCP to improve this. Lantus restarted and now can restart metformin.CBG starting to much improved ranging from 130s to 160s.  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 surgery  Procedures:  Status post I and D in the emergency room  Antibiotics:  Cefepime and vancomycin day 3-stopped today   Started on by mouth Levaquin-day one  HPI/Subjective: Feeling better. Patient still with some pain still.. Sitz bath helping.   Objective: Filed Vitals:   02/27/12 1035 02/27/12 1510 02/27/12 2146 02/28/12 0619  BP: 106/67 109/78 110/70 107/71  Pulse: 70 77 78 70  Temp: 97.7 F (36.5 C) 98.5 F (36.9 C) 98.2 F (36.8 C) 98.1 F (36.7 C)  TempSrc: Oral Oral Oral Oral  Resp: 18 20 20 18   Height:      Weight:      SpO2: 99% 98% 93% 95%    Intake/Output Summary (Last 24 hours) at 02/28/12 1431 Last data filed at  02/28/12 1400  Gross per 24 hour  Intake    760 ml  Output   1400 ml  Net   -640 ml    Exam:   General: Alert and oriented x3, no acute distress, fatigued, looks about stated age  HEENT: Normocephalic, atraumatic, extremities are slightly dry  Cardiovascular: Regular rate and rhythm, S1-S2  Lungs: Clear to auscultation bilaterally  Abdomen: Soft, nontender, nondistended, positive bowel sounds  Extremity: No clubbing or cyanosis or edema  Data Reviewed: Basic Metabolic Panel:  Lab 02/25/12 8119 02/24/12 2304  NA 137 137  K 3.5 4.4  CL 99 98  CO2 25 26  GLUCOSE 218* 340*  BUN 8 7  CREATININE 0.46* 0.49*  CALCIUM 9.1 9.5  MG -- --  PHOS -- --   Liver Function Tests:  Lab 02/25/12 0645  AST 10  ALT 11  ALKPHOS 99  BILITOT 0.3  PROT 6.7  ALBUMIN 3.2*   CBC:  Lab 02/25/12 0645 02/24/12 2304  WBC 9.8 9.8  NEUTROABS 6.7 6.6  HGB 13.6 14.3  HCT 38.9 39.5  MCV 86.3 85.1  PLT 234 264   CBG:  Lab 02/28/12 0402 02/27/12 2352 02/27/12 1951 02/27/12 1629 02/27/12 1215  GLUCAP 87 163* 219* 224* 139*      Studies: Ct Pelvis W Contrast  02/25/2012  IMPRESSION: Left medial peroneal and inferior gluteal subcutaneous fatty infiltration suggesting cellulitis.  Suggestion of a tiny gas bubble focally.  No fluid collection to suggest abscess.  No fistulas demonstrated.  Original Report Authenticated By: Marlon Pel, M.D.    Scheduled Meds:    .  buPROPion  150 mg Oral Daily  . ceFEPime (MAXIPIME) IV  1 g Intravenous Q12H  . Chlorhexidine Gluconate Cloth  6 each Topical Q0600  . citalopram  40 mg Oral Daily  . insulin aspart  0-20 Units Subcutaneous Q4H  . insulin glargine  30 Units Subcutaneous QHS  . levofloxacin  750 mg Oral Daily  . living well with diabetes book   Does not apply Once  . metFORMIN  1,000 mg Oral BID WC  . mupirocin ointment  1 application Nasal BID  . nortriptyline  100 mg Oral QHS  . oxyCODONE  10 mg Oral Q12H  . DISCONTD:  vancomycin  1,000 mg Intravenous Q8H   Continuous Infusions:    . sodium chloride 75 mL/hr at 02/27/12 1400  . DISCONTD: lactated ringers 50 mL/hr at 02/28/12 0403     Time spent: 25 minutes    Hollice Espy  [facility] Pager [phone]. If 8PM-8AM, please contact night-coverage at [email or web address], password [facility] 02/28/2012, 2:31 PM  LOS: 4 days

## 2012-02-28 NOTE — Progress Notes (Signed)
2 Days Post-Op  Subjective: She still having some pain but otherwise doing okay. She has trouble keeping the packing in the wound is fairly superficial now.  Objective: Vital signs in last 24 hours: Temp:  [97.7 F (36.5 C)-98.5 F (36.9 C)] 98.1 F (36.7 C) (08/10 0619) Pulse Rate:  [70-78] 70  (08/10 0619) Resp:  [18-20] 18  (08/10 0619) BP: (106-110)/(67-78) 107/71 mmHg (08/10 0619) SpO2:  [93 %-99 %] 95 % (08/10 0619)   Intake/Output from previous day: 08/09 0701 - 08/10 0700 In: 1817.5 [P.O.:840; I.V.:727.5; IV Piggyback:250] Out: 1200 [Urine:1200] Intake/Output this shift:     General appearance: alert and no distress  Incision: Wound has packing in it otherwise appears fairly clean  Lab Results:  No results found for this basename: WBC:2,HGB:2,HCT:2,PLT:2 in the last 72 hours BMET No results found for this basename: NA:2,K:2,CL:2,CO2:2,GLUCOSE:2,BUN:2,CREATININE:2,CALCIUM:2 in the last 72 hours PT/INR No results found for this basename: LABPROT:2,INR:2 in the last 72 hours ABG No results found for this basename: PHART:2,PCO2:2,PO2:2,HCO3:2 in the last 72 hours  MEDS, Scheduled    . buPROPion  150 mg Oral Daily  . ceFEPime (MAXIPIME) IV  1 g Intravenous Q12H  . Chlorhexidine Gluconate Cloth  6 each Topical Q0600  . citalopram  40 mg Oral Daily  . fluconazole  150 mg Oral Once  . insulin aspart  0-20 Units Subcutaneous Q4H  . insulin glargine  30 Units Subcutaneous QHS  . living well with diabetes book   Does not apply Once  . metFORMIN  1,000 mg Oral BID WC  . mupirocin ointment  1 application Nasal BID  . nortriptyline  100 mg Oral QHS  . vancomycin  1,000 mg Intravenous Q8H    Studies/Results: No results found.  Assessment: s/p Procedure(s): IRRIGATION AND DEBRIDEMENT PERIRECTAL ABSCESS Patient Active Problem List  Diagnosis  . Cellulitis of buttock  . DM (diabetes mellitus), type 2, uncontrolled  . Yeast infection    Improve status post  debridement of pararectal perineal abscess  Plan: okay to discharge from surgical standpoint. She'll need followup in our office with Dr. Ovidio Kin in a week to 2 weeks.   LOS: 4 days     Currie Paris, MD, [facility], [place] [phone]   02/28/2012 9:45 AM

## 2012-02-28 NOTE — Progress Notes (Addendum)
Cm spoke with patient concerning Cm consult for medication assistance. Patient eligible for indigent funds for non-generic drugs. Patient provided with generic drug information for Celexa, metformin, & nortriptyline. Patient provided with needymeds.com applications for Wellbutrin Lantus. Patient states having two Lantus flex pens at home for Dm management. Patient medicaid pending. Provided with information for [employer].PCP - Ms. Arletha Grippe at [facility]   No other needs specified at this time.   Leonie Green [phone]

## 2012-02-29 LAB — GLUCOSE, CAPILLARY: Glucose-Capillary: 146 mg/dL — ABNORMAL HIGH (ref 70–99)

## 2012-02-29 MED ORDER — ACETAMINOPHEN 325 MG PO TABS: 650.0000 mg | ORAL_TABLET | Freq: Four times a day (QID) | ORAL | Status: AC | PRN

## 2012-02-29 MED ORDER — OXYCODONE HCL 10 MG PO TB12: 10.0000 mg | ORAL_TABLET | Freq: Two times a day (BID) | ORAL | Status: DC

## 2012-02-29 MED ORDER — MUPIROCIN 2 % EX OINT: 1.0000 "application " | TOPICAL_OINTMENT | Freq: Two times a day (BID) | CUTANEOUS | Status: AC

## 2012-02-29 MED ORDER — OXYCODONE HCL 5 MG PO CAPS: 5.0000 mg | ORAL_CAPSULE | ORAL | Status: DC | PRN

## 2012-02-29 MED ORDER — LEVOFLOXACIN 750 MG PO TABS: 750.0000 mg | ORAL_TABLET | Freq: Every day | ORAL | Status: AC

## 2012-02-29 MED ORDER — INSULIN GLARGINE 100 UNIT/ML ~~LOC~~ SOLN: 30.0000 [IU] | Freq: Every day | SUBCUTANEOUS | Status: DC

## 2012-02-29 NOTE — Progress Notes (Signed)
Pt discharged to home accomp by family.  All Rx given and reviewed.  Pt understands wound care and follow up.  Supplies given and explained for wound care.  No further questions about home self care.

## 2012-02-29 NOTE — Progress Notes (Signed)
3 Days Post-Op  Subjective: Feels okay this morning. Has a little firmness around the abscess cavity and cannot really pack it anymore.  Objective: Vital signs in last 24 hours: Temp:  [97.8 F (36.6 C)-97.9 F (36.6 C)] 97.9 F (36.6 C) (08/11 0615) Pulse Rate:  [77] 77  (08/11 0615) Resp:  [18] 18  (08/11 0615) BP: (110-112)/(67-74) 110/67 mmHg (08/11 0615) SpO2:  [98 %] 98 % (08/11 0615)   Intake/Output from previous day: 08/10 0701 - 08/11 0700 In: 1904 [P.O.:1440; I.V.:464] Out: 2400 [Urine:2400] Intake/Output this shift:     General appearance: alert and no distress  Incision: The wound is clean. There is mild induration but as expected. It seems to be closing down the cavity is smaller.  Lab Results:  No results found for this basename: WBC:2,HGB:2,HCT:2,PLT:2 in the last 72 hours BMET No results found for this basename: NA:2,K:2,CL:2,CO2:2,GLUCOSE:2,BUN:2,CREATININE:2,CALCIUM:2 in the last 72 hours PT/INR No results found for this basename: LABPROT:2,INR:2 in the last 72 hours ABG No results found for this basename: PHART:2,PCO2:2,PO2:2,HCO3:2 in the last 72 hours  MEDS, Scheduled    . buPROPion  150 mg Oral Daily  . Chlorhexidine Gluconate Cloth  6 each Topical Q0600  . citalopram  40 mg Oral Daily  . insulin aspart  0-20 Units Subcutaneous Q4H  . insulin glargine  30 Units Subcutaneous QHS  . levofloxacin  750 mg Oral Daily  . living well with diabetes book   Does not apply Once  . metFORMIN  1,000 mg Oral BID WC  . mupirocin ointment  1 application Nasal BID  . nortriptyline  100 mg Oral QHS  . oxyCODONE  10 mg Oral Q12H  . DISCONTD: ceFEPime (MAXIPIME) IV  1 g Intravenous Q12H  . DISCONTD: vancomycin  1,000 mg Intravenous Q8H    Studies/Results: No results found.  Assessment: s/p Procedure(s): IRRIGATION AND DEBRIDEMENT PERIRECTAL ABSCESS Patient Active Problem List  Diagnosis  . Cellulitis of buttock  . DM (diabetes mellitus), type 2,  uncontrolled  . Yeast infection    Improved with local wound care and antibiotics.  Plan: Discharge When okay with primary care. She should do sitz baths twice a day at home. We will need to see her in [facility] in about a week  LOS: 5 days     Currie Paris, MD, [facility], [place] [phone]   02/29/2012 9:39 AM

## 2012-02-29 NOTE — Discharge Summary (Signed)
Physician Discharge Summary  [name] EAV:[id number] DOB: June 09, 1971 DOA: 02/24/2012  PCP: REDMON,NOELLE, PA  Admit date: 02/24/2012 Discharge date: 02/29/2012  Recommendations for Outpatient Follow-up:  1. Wound care: Twice a day sitz bath plus dressing changes after 2. Followup with Gen. surgery in the next one to 2 weeks  Discharge Diagnoses:  Principal Problem:  *Cellulitis of buttock Active Problems:  DM (diabetes mellitus), type 2, uncontrolled  Yeast infection   Discharge Condition: Improved, being discharged to home.  Diet recommendation: Carb modified  Filed Weights   02/25/12 0620  Weight: 78.472 kg (173 lb)    History of present illness:  Patient is a 41 year old white female with very poorly controlled diabetes who presented with a initial pimple which was scratched and became erythematous and soon an abscess.  Hospital Course:  Cellulitis/abscess: Patient was initially I&D in the emergency room and sent home but then came back with increased redness and swelling and increased blood sugars and was admitted and started on IV broad-spectrum antibiotics. General surgery was consulted and an area had not improved, patient was taken for incision and drainage of the perineal abscess. She tolerated this well. Cultures grew back positive for MRSA as did colonization. Patient was started on Bactroban and when sensitivities returned, she was changed from broad-spectrum antibiotics to by mouth Levaquin. She continued to have several days of pain control and wound care and was feeling much better by 02/29/12. She'll be discharged home on 5 more days of by mouth Levaquin for total 10 days of therapy  Diabetes mellitus: Patient's A1c was elevated at 11.5. We will counsel her about better control of her sugars. She was discharged on an increased dose of Lantus at 30 units subcutaneous each bedtime. Her metformin was briefly held for 2 days for IV dye protocol when she initially went CT  scan to evaluate for abscess. This was resumed on day prior to discharge.  Procedures:  I & D of perineal abscess on 02/26/12  Consultations:  Ovidio Kin general surgery   Discharge Exam: Filed Vitals:   02/29/12 0615  BP: 110/67  Pulse: 77  Temp: 97.9 F (36.6 C)  Resp: 18   Filed Vitals:   02/27/12 2146 02/28/12 0619 02/28/12 1450 02/29/12 0615  BP: 110/70 107/71 112/74 110/67  Pulse: 78 70  77  Temp: 98.2 F (36.8 C) 98.1 F (36.7 C) 97.8 F (36.6 C) 97.9 F (36.6 C)  TempSrc: Oral Oral Oral Oral  Resp: 20 18 18 18   Height:      Weight:      SpO2: 93% 95% 98% 98%    General: Alert and oriented x3, no acute distress, fatigued, looks about stated age  HEENT: Normocephalic, atraumatic, extremities are slightly dry  Cardiovascular: Regular rate and rhythm, S1-S2  Lungs: Clear to auscultation bilaterally  Abdomen: Soft, nontender, nondistended, positive bowel sounds  Extremity: No clubbing or cyanosis or edema   Discharge Instructions  Discharge Orders    Future Orders Please Complete By Expires   Diet Carb Modified      Increase activity slowly      Discharge wound care:      Comments:   Sitz baths twice a day   Change dressing (specify)      Comments:   Dressing change: 2 times per day after sitza bath using saline solution to dry gauze.     Medication List  As of 02/29/2012  1:57 PM   STOP taking these medications  cephALEXin 500 MG capsule      oxyCODONE-acetaminophen 5-325 MG per tablet         TAKE these medications         acetaminophen 325 MG tablet   Commonly known as: TYLENOL   Take 2 tablets (650 mg total) by mouth every 6 (six) hours as needed (or Fever >/= 101).      buPROPion 150 MG 24 hr tablet   Commonly known as: WELLBUTRIN XL   Take 150 mg by mouth daily.      citalopram 40 MG tablet   Commonly known as: CELEXA   Take 40 mg by mouth daily.      insulin glargine 100 UNIT/ML injection   Commonly known as: LANTUS    Inject 30 Units into the skin at bedtime.      levofloxacin 750 MG tablet   Commonly known as: LEVAQUIN   Take 1 tablet (750 mg total) by mouth daily.   Start taking on: 03/01/2012      metFORMIN 1000 MG tablet   Commonly known as: GLUCOPHAGE   Take 1,000 mg by mouth 2 (two) times daily with a meal.      mupirocin ointment 2 %   Commonly known as: BACTROBAN   Apply 1 application topically 2 (two) times daily.      nortriptyline 50 MG capsule   Commonly known as: PAMELOR   Take 100 mg by mouth at bedtime.      oxycodone 5 MG capsule   Commonly known as: OXY-IR   Take 1 capsule (5 mg total) by mouth every 4 (four) hours as needed.      oxyCODONE 10 MG 12 hr tablet   Commonly known as: OXYCONTIN   Take 1 tablet (10 mg total) by mouth every 12 (twelve) hours.           Follow-up Information    Follow up with [facility]. Schedule an appointment as soon as possible for a visit in 1 week.   Contact information:   [phone]          The results of significant diagnostics from this hospitalization (including imaging, microbiology, ancillary and laboratory) are listed below for reference.    Significant Diagnostic Studies: Ct Pelvis W Contrast  02/25/2012  IMPRESSION: Left medial peroneal and inferior gluteal subcutaneous fatty infiltration suggesting cellulitis.  Suggestion of a tiny gas bubble focally.  No fluid collection to suggest abscess.  No fistulas demonstrated.  Original Report Authenticated By: Marlon Pel, M.D.    Microbiology: Recent Results (from the past 240 hour(s))  URINE CULTURE     Status: Normal   Collection Time   02/25/12 12:10 AM      Component Value Range Status Comment   Specimen Description URINE, CLEAN CATCH   Final    Special Requests NONE   Final    Culture  Setup Time 02/25/2012 00:51   Final    Colony Count 10,000 COLONIES/ML   Final    Culture     Final    Value: Multiple bacterial morphotypes present, none predominant.  Suggest appropriate recollection if clinically indicated.   Report Status 02/26/2012 FINAL   Final   WOUND CULTURE     Status: Normal   Collection Time   02/25/12  6:55 AM      Component Value Range Status Comment   Specimen Description WOUND PERIRECTAL   Final    Special Requests Immunocompromised   Final  Gram Stain     Final    Value: NO WBC SEEN     NO SQUAMOUS EPITHELIAL CELLS SEEN     NO ORGANISMS SEEN   Culture     Final    Value: ABUNDANT METHICILLIN RESISTANT STAPHYLOCOCCUS AUREUS     Note: RIFAMPIN AND GENTAMICIN SHOULD NOT BE USED AS SINGLE DRUGS FOR TREATMENT OF STAPH INFECTIONS. This organism DOES NOT demonstrate inducible Clindamycin resistance in vitro. CRITICAL RESULT CALLED TO, READ BACK BY AND VERIFIED WITH: CAROL S.02/27/12      @1300  BY REAMM   Report Status 02/27/2012 FINAL   Final    Organism ID, Bacteria METHICILLIN RESISTANT STAPHYLOCOCCUS AUREUS   Final   SURGICAL PCR SCREEN     Status: Abnormal   Collection Time   02/26/12  8:25 AM      Component Value Range Status Comment   MRSA, PCR POSITIVE (*) NEGATIVE Final    Staphylococcus aureus POSITIVE (*) NEGATIVE Final      Labs: Basic Metabolic Panel:  Lab 02/25/12 1610 02/24/12 2304  NA 137 137  K 3.5 4.4  CL 99 98  CO2 25 26  GLUCOSE 218* 340*  BUN 8 7  CREATININE 0.46* 0.49*  CALCIUM 9.1 9.5  MG -- --  PHOS -- --   Liver Function Tests:  Lab 02/25/12 0645  AST 10  ALT 11  ALKPHOS 99  BILITOT 0.3  PROT 6.7  ALBUMIN 3.2*   CBC:  Lab 02/25/12 0645 02/24/12 2304  WBC 9.8 9.8  NEUTROABS 6.7 6.6  HGB 13.6 14.3  HCT 38.9 39.5  MCV 86.3 85.1  PLT 234 264   CBG:  Lab 02/29/12 0346 02/28/12 2343 02/28/12 2027 02/28/12 1720 02/28/12 1236  GLUCAP 146* 143* 211* 114* 138*    Time coordinating discharge: 35 minutes  Signed:  Hollice Espy  [facility] 02/29/2012, 1:57 PM

## 2012-03-01 LAB — GLUCOSE, CAPILLARY

## 2012-03-04 ENCOUNTER — Telehealth (INDEPENDENT_AMBULATORY_CARE_PROVIDER_SITE_OTHER): Payer: Self-pay

## 2012-03-04 NOTE — Telephone Encounter (Signed)
I left the pt a message to call me and schedule her follow up

## 2012-03-04 NOTE — Telephone Encounter (Signed)
The patient called me back and I gave her an appt for tomorrow because she has some concerns.

## 2012-03-05 ENCOUNTER — Encounter (INDEPENDENT_AMBULATORY_CARE_PROVIDER_SITE_OTHER): Payer: Self-pay | Admitting: Surgery

## 2012-03-05 ENCOUNTER — Ambulatory Visit (INDEPENDENT_AMBULATORY_CARE_PROVIDER_SITE_OTHER): Payer: Medicaid Other | Admitting: Surgery

## 2012-03-05 VITALS — BP 112/66 | HR 70 | Temp 98.1°F | Resp 14 | Ht 66.0 in | Wt 175.0 lb

## 2012-03-05 DIAGNOSIS — L03317 Cellulitis of buttock: Secondary | ICD-10-CM

## 2012-03-05 DIAGNOSIS — L0231 Cutaneous abscess of buttock: Secondary | ICD-10-CM

## 2012-03-05 NOTE — Progress Notes (Signed)
[facility]  Ovidio Kin, MD,  FACS [address]  [address] [place], [place]    [address] Phone:  [phone] FAX:  [phone]   Re:   WAJIHA VERSTEEG DOB:   1971-03-26 MRN:   [id number]  ASSESSMENT AND PLAN: 1.  Left perineal abscess - I&D 02/26/2012  Looks good.  Continue BID dressing changes.  See me back in about 3 weeks.  2.  Diabetes mellitus -  Seeing Noelle Redmon  HISTORY OF PRESENT ILLNESS: Chief Complaint  Patient presents with  . Routine Post Op    I&D perirectal abscess 02/26/12   MERLIN EGE is a 41 y.o. (DOB: 25-Jun-1971)  white female who is a patient of REDMON,NOELLE, PA and comes to me today for follow up of left perineal abscess.  She has done well since going home, though the wound is sore and still drains.  She is having no other problems.  She sees Noelle Redmon today.  PHYSICAL EXAM: BP 112/66  Pulse 70  Temp 98.1 F (36.7 C) (Temporal)  Resp 14  Ht 5\' 6"  (1.676 m)  Wt 175 lb (79.379 kg)  BMI 28.25 kg/m2  LMP 02/10/2012  Perineum - left perineal wound about 3 cm deep, but well drained and clean.  I repacked the wound.  DATA REVIEWED: None new  Ovidio Kin, MD, FACS Office:  [phone]

## 2012-03-25 ENCOUNTER — Encounter (INDEPENDENT_AMBULATORY_CARE_PROVIDER_SITE_OTHER): Payer: Medicaid Other | Admitting: Surgery

## 2012-04-06 ENCOUNTER — Encounter (INDEPENDENT_AMBULATORY_CARE_PROVIDER_SITE_OTHER): Payer: Self-pay | Admitting: Surgery

## 2012-09-22 ENCOUNTER — Emergency Department (HOSPITAL_BASED_OUTPATIENT_CLINIC_OR_DEPARTMENT_OTHER)
Admission: EM | Admit: 2012-09-22 | Discharge: 2012-09-22 | Disposition: A | Discharge status: Home / Self Care | Payer: Medicaid Other | Attending: Emergency Medicine | Admitting: Emergency Medicine

## 2012-09-22 ENCOUNTER — Encounter (HOSPITAL_BASED_OUTPATIENT_CLINIC_OR_DEPARTMENT_OTHER): Payer: Self-pay

## 2012-09-22 DIAGNOSIS — F3289 Other specified depressive episodes: Secondary | ICD-10-CM | POA: Insufficient documentation

## 2012-09-22 DIAGNOSIS — S01511A Laceration without foreign body of lip, initial encounter: Secondary | ICD-10-CM

## 2012-09-22 DIAGNOSIS — S01501A Unspecified open wound of lip, initial encounter: Secondary | ICD-10-CM | POA: Insufficient documentation

## 2012-09-22 DIAGNOSIS — Y939 Activity, unspecified: Secondary | ICD-10-CM | POA: Insufficient documentation

## 2012-09-22 DIAGNOSIS — W010XXA Fall on same level from slipping, tripping and stumbling without subsequent striking against object, initial encounter: Secondary | ICD-10-CM | POA: Insufficient documentation

## 2012-09-22 DIAGNOSIS — E119 Type 2 diabetes mellitus without complications: Secondary | ICD-10-CM | POA: Insufficient documentation

## 2012-09-22 DIAGNOSIS — S8000XA Contusion of unspecified knee, initial encounter: Secondary | ICD-10-CM | POA: Insufficient documentation

## 2012-09-22 DIAGNOSIS — Y929 Unspecified place or not applicable: Secondary | ICD-10-CM | POA: Insufficient documentation

## 2012-09-22 DIAGNOSIS — Z23 Encounter for immunization: Secondary | ICD-10-CM | POA: Insufficient documentation

## 2012-09-22 DIAGNOSIS — F329 Major depressive disorder, single episode, unspecified: Secondary | ICD-10-CM | POA: Insufficient documentation

## 2012-09-22 DIAGNOSIS — Z872 Personal history of diseases of the skin and subcutaneous tissue: Secondary | ICD-10-CM | POA: Insufficient documentation

## 2012-09-22 DIAGNOSIS — S8002XA Contusion of left knee, initial encounter: Secondary | ICD-10-CM

## 2012-09-22 DIAGNOSIS — S8990XA Unspecified injury of unspecified lower leg, initial encounter: Secondary | ICD-10-CM | POA: Insufficient documentation

## 2012-09-22 DIAGNOSIS — Z794 Long term (current) use of insulin: Secondary | ICD-10-CM | POA: Insufficient documentation

## 2012-09-22 MED ORDER — TRAMADOL HCL 50 MG PO TABS: 50.0000 mg | ORAL_TABLET | Freq: Four times a day (QID) | ORAL | Status: DC | PRN

## 2012-09-22 MED ORDER — SULFAMETHOXAZOLE-TRIMETHOPRIM 800-160 MG PO TABS: 1.0000 | ORAL_TABLET | Freq: Two times a day (BID) | ORAL | Status: DC

## 2012-09-22 MED ORDER — TETANUS-DIPHTH-ACELL PERTUSSIS 5-2.5-18.5 LF-MCG/0.5 IM SUSP
0.5000 mL | Freq: Once | INTRAMUSCULAR | Status: AC
  Administered 2012-09-22: 0.5 mL via INTRAMUSCULAR
  Filled 2012-09-22: qty 0.5

## 2012-09-22 MED ORDER — IBUPROFEN 600 MG PO TABS: 600.0000 mg | ORAL_TABLET | Freq: Four times a day (QID) | ORAL | Status: DC | PRN

## 2012-09-22 NOTE — ED Notes (Signed)
Pt reports she was slipped on floor striking face on the floor.  Laceration to lower lip and loose teeth.  She also has left knee pain.

## 2012-09-22 NOTE — ED Provider Notes (Signed)
History     CSN: [id number]  Arrival date & time 09/22/12  1754   First MD Initiated Contact with Patient 09/22/12 1932      Chief Complaint  Patient presents with  . Fall  . Lip Laceration  . Knee Injury    (Consider location/radiation/quality/duration/timing/severity/associated sxs/prior treatment) HPI  Past Medical History  Diagnosis Date  . Diabetes mellitus   . Depression   . Perirectal abscess     Past Surgical History  Procedure Laterality Date  . Patella release and manipulation    . Tonsillectomy      at 42 years of age  . Incision and drainage perirectal abscess  02/26/2012    Procedure: IRRIGATION AND DEBRIDEMENT PERIRECTAL ABSCESS;  Surgeon: Kandis Cocking, MD;  Location: [facility] OR;  Service: General;  Laterality: Left;  . Cesarean section  12/21/2008  . Breast lumpectomy      at age 37    Family History  Problem Relation Age of Onset  . Hypertension Mother   . Lung cancer Mother   . Cancer Mother     stage 4 lung     History  Substance Use Topics  . Smoking status: Passive Smoke Exposure - Never Smoker  . Smokeless tobacco: Never Used  . Alcohol Use: Yes     Comment: occasional     OB History   Grav Para Term Preterm Abortions TAB SAB Ect Mult Living                  Review of Systems  Allergies  Review of patient's allergies indicates no known allergies.  Home Medications   Current Outpatient Rx  Name  Route  Sig  Dispense  Refill  . insulin NPH-insulin regular (NOVOLIN 70/30) (70-30) 100 UNIT/ML injection   Subcutaneous   Inject 3 Units into the skin.         Marland Kitchen acetaminophen (TYLENOL) 325 MG tablet   Oral   Take 2 tablets (650 mg total) by mouth every 6 (six) hours as needed (or Fever >/= 101).         Marland Kitchen buPROPion (WELLBUTRIN XL) 150 MG 24 hr tablet   Oral   Take 150 mg by mouth daily.         . citalopram (CELEXA) 40 MG tablet   Oral   Take 40 mg by mouth daily.         . insulin glargine (LANTUS) 100 UNIT/ML  injection   Subcutaneous   Inject 30 Units into the skin at bedtime.   10 mL   0   . metFORMIN (GLUCOPHAGE) 1000 MG tablet   Oral   Take 1,000 mg by mouth 2 (two) times daily with a meal.           BP 138/82  Pulse 93  Resp 18  Ht 5\' 6"  (1.676 m)  Wt 156 lb (70.761 kg)  BMI 25.19 kg/m2  SpO2 97%  LMP 09/08/2012  Physical Exam  ED Course  LACERATION REPAIR Date/Time: 09/22/2012 8:46 PM Performed by: Elson Areas Authorized by: Elson Areas Consent: Verbal consent obtained. Risks and benefits: risks, benefits and alternatives were discussed Consent given by: patient Patient understanding: patient states understanding of the procedure being performed Patient identity confirmed: verbally with patient Body area: mouth Location details: lower lip, interior Laceration length: 0.8 cm Foreign bodies: no foreign bodies Tendon involvement: none Vascular damage: no Anesthesia: local infiltration Local anesthetic: lidocaine 2% without epinephrine Preparation: Patient was prepped  and draped in the usual sterile fashion. Debridement: none Subcutaneous closure: 5-0 Vicryl Number of sutures: 2 Technique: simple Approximation: loose Approximation difficulty: simple Patient tolerance: Patient tolerated the procedure well with no immediate complications.   (including critical care time)  Labs Reviewed - No data to display No results found.   No diagnosis found.    MDM          Elson Areas, PA-C 09/22/12 2048

## 2012-09-22 NOTE — ED Provider Notes (Signed)
History     CSN: [id number]  Arrival date & time 09/22/12  1754   First MD Initiated Contact with Patient 09/22/12 1932      Chief Complaint  Patient presents with  . Fall  . Lip Laceration  . Knee Injury    (Consider location/radiation/quality/duration/timing/severity/associated sxs/prior treatment) HPI Pt with slip and fall at ground level striking face on wood. Floor. No LOC. C/o facial and dental pain and L knee pain. No neck pain. No focal weakness or sensory loss.  Past Medical History  Diagnosis Date  . Diabetes mellitus   . Depression   . Perirectal abscess     Past Surgical History  Procedure Laterality Date  . Patella release and manipulation    . Tonsillectomy      at 42 years of age  . Incision and drainage perirectal abscess  02/26/2012    Procedure: IRRIGATION AND DEBRIDEMENT PERIRECTAL ABSCESS;  Surgeon: Kandis Cocking, MD;  Location: [facility] OR;  Service: General;  Laterality: Left;  . Cesarean section  12/21/2008  . Breast lumpectomy      at age 88    Family History  Problem Relation Age of Onset  . Hypertension Mother   . Lung cancer Mother   . Cancer Mother     stage 4 lung     History  Substance Use Topics  . Smoking status: Passive Smoke Exposure - Never Smoker  . Smokeless tobacco: Never Used  . Alcohol Use: Yes     Comment: occasional     OB History   Grav Para Term Preterm Abortions TAB SAB Ect Mult Living                  Review of Systems  Constitutional: Negative for fever and chills.  HENT: Negative for nosebleeds and neck pain.   Eyes: Negative for visual disturbance.  Respiratory: Negative for cough and shortness of breath.   Cardiovascular: Negative for chest pain.  Gastrointestinal: Negative for nausea, vomiting and abdominal pain.  Musculoskeletal: Negative for myalgias and back pain.  Skin: Positive for wound. Negative for rash.  Neurological: Negative for syncope, weakness, light-headedness, numbness and headaches.  All  other systems reviewed and are negative.    Allergies  Review of patient's allergies indicates no known allergies.  Home Medications   Current Outpatient Rx  Name  Route  Sig  Dispense  Refill  . insulin NPH-insulin regular (NOVOLIN 70/30) (70-30) 100 UNIT/ML injection   Subcutaneous   Inject 3 Units into the skin.         Marland Kitchen acetaminophen (TYLENOL) 325 MG tablet   Oral   Take 2 tablets (650 mg total) by mouth every 6 (six) hours as needed (or Fever >/= 101).         Marland Kitchen buPROPion (WELLBUTRIN XL) 150 MG 24 hr tablet   Oral   Take 150 mg by mouth daily.         . citalopram (CELEXA) 40 MG tablet   Oral   Take 40 mg by mouth daily.         Marland Kitchen ibuprofen (ADVIL,MOTRIN) 600 MG tablet   Oral   Take 1 tablet (600 mg total) by mouth every 6 (six) hours as needed for pain.   30 tablet   0   . insulin glargine (LANTUS) 100 UNIT/ML injection   Subcutaneous   Inject 30 Units into the skin at bedtime.   10 mL   0   . metFORMIN (GLUCOPHAGE)  1000 MG tablet   Oral   Take 1,000 mg by mouth 2 (two) times daily with a meal.         . sulfamethoxazole-trimethoprim (SEPTRA DS) 800-160 MG per tablet   Oral   Take 1 tablet by mouth every 12 (twelve) hours.   14 tablet   0   . traMADol (ULTRAM) 50 MG tablet   Oral   Take 1 tablet (50 mg total) by mouth every 6 (six) hours as needed for pain.   15 tablet   0     BP 138/82  Pulse 93  Resp 18  Ht 5\' 6"  (1.676 m)  Wt 156 lb (70.761 kg)  BMI 25.19 kg/m2  SpO2 97%  LMP 09/08/2012  Physical Exam  Nursing note and vitals reviewed. Constitutional: She is oriented to person, place, and time. She appears well-developed and well-nourished. No distress.  HENT:  Head: Normocephalic.  Mouth/Throat: Oropharynx is clear and moist.    Eyes: EOM are normal. Pupils are equal, round, and reactive to light.  Neck: Normal range of motion. Neck supple.  No posterior cervical TTP  Cardiovascular: Normal rate and regular rhythm.     Pulmonary/Chest: Effort normal and breath sounds normal. No respiratory distress. She has no wheezes. She has no rales.  Abdominal: Soft. Bowel sounds are normal. She exhibits no distension and no mass. There is no tenderness. There is no rebound and no guarding.  Musculoskeletal: Normal range of motion. She exhibits no edema and no tenderness.  Mild TTP of L medial knee with out obvious injury deformity or ligamentous instability.   Neurological: She is alert and oriented to person, place, and time.  5/5 motor in all ext, sensation intact.   Skin: Skin is warm and dry. No rash noted. No erythema.  Psychiatric: She has a normal mood and affect. Her behavior is normal.    ED Course  Procedures (including critical care time)  Labs Reviewed - No data to display No results found.   1. Lip laceration, initial encounter   2. Knee contusion, left, initial encounter     Laceration repair by PA. See procedure note.   MDM          Loren Racer, MD 09/23/12 1539

## 2012-09-23 NOTE — ED Provider Notes (Signed)
Medical screening examination/treatment/procedure(s) were conducted as a shared visit with non-physician practitioner(s) and myself.  I personally evaluated the patient during the encounter   Loren Racer, MD 09/23/12 1500

## 2012-12-08 ENCOUNTER — Other Ambulatory Visit: Payer: Self-pay

## 2012-12-08 DIAGNOSIS — Z1231 Encounter for screening mammogram for malignant neoplasm of breast: Secondary | ICD-10-CM

## 2012-12-22 ENCOUNTER — Other Ambulatory Visit: Payer: Self-pay

## 2012-12-22 ENCOUNTER — Ambulatory Visit
Admission: RE | Admit: 2012-12-22 | Discharge: 2012-12-22 | Disposition: A | Discharge status: Home / Self Care | Payer: Medicaid Other | Source: Ambulatory Visit

## 2012-12-22 DIAGNOSIS — Z1231 Encounter for screening mammogram for malignant neoplasm of breast: Secondary | ICD-10-CM

## 2012-12-23 ENCOUNTER — Other Ambulatory Visit: Payer: Self-pay | Admitting: Physician Assistant

## 2012-12-23 DIAGNOSIS — N63 Unspecified lump in unspecified breast: Secondary | ICD-10-CM

## 2012-12-29 ENCOUNTER — Ambulatory Visit
Admission: RE | Admit: 2012-12-29 | Discharge: 2012-12-29 | Disposition: A | Discharge status: Home / Self Care | Payer: Medicaid Other | Source: Ambulatory Visit | Attending: Physician Assistant | Admitting: Physician Assistant

## 2012-12-29 DIAGNOSIS — N63 Unspecified lump in unspecified breast: Secondary | ICD-10-CM

## 2013-08-26 IMAGING — CT CT PELVIS W/ CM
2 of 3 series · 17 of 46 positions shown, 19 images · IV contrast (APPLIED)
Comparison: None.

CLINICAL DATA: But the bite to the inner thigh over the weekend
now with increasing redness, swelling, and tenderness.

CT PELVIS WITH CONTRAST
TECHNIQUE: Multidetector CT imaging of the pelvis was performed
using the standard protocol following the bolus administration of
intravenous contrast.
Contrast: 100mL OMNIPAQUE IOHEXOL 300 MG/ML  SOLN

[Series 2: pelvis 5.0 b31f · axial · 0.87mm/px · z∈[-416,-126]mm · 14 of 68 slices shown, 16 images]
[im 5/68  soft-tissue]
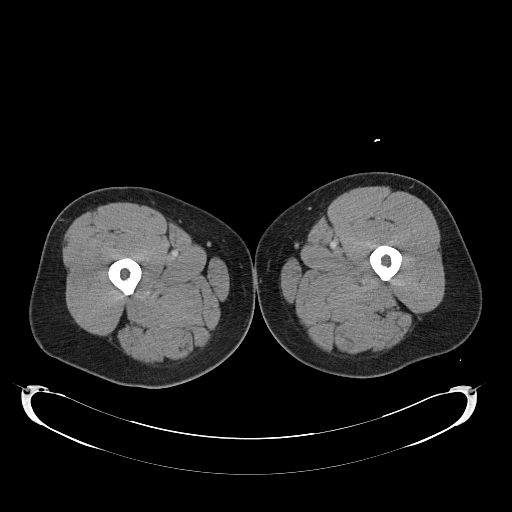
[im 5/68  bone]
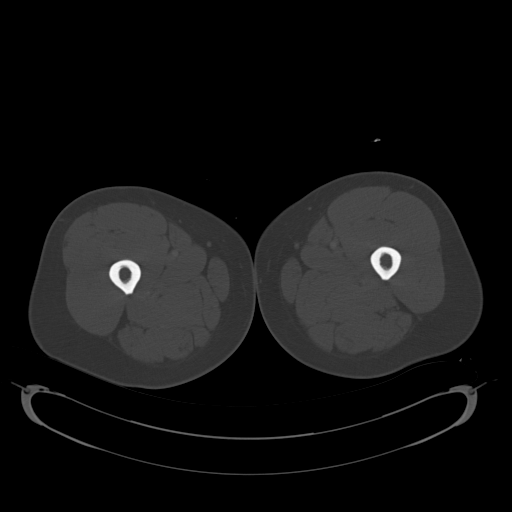
[im 9/68  soft-tissue]
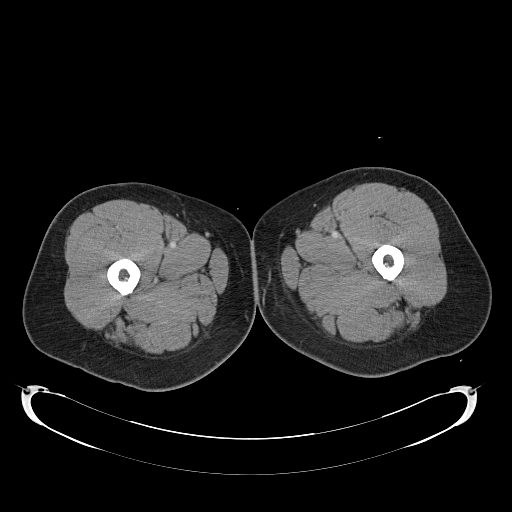
[im 13/68  soft-tissue]
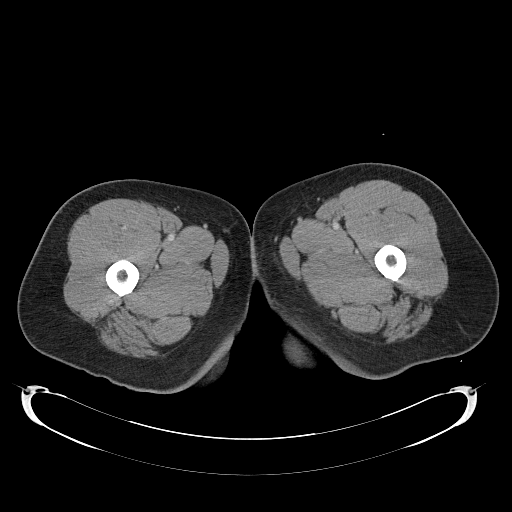
[im 18/68  soft-tissue]
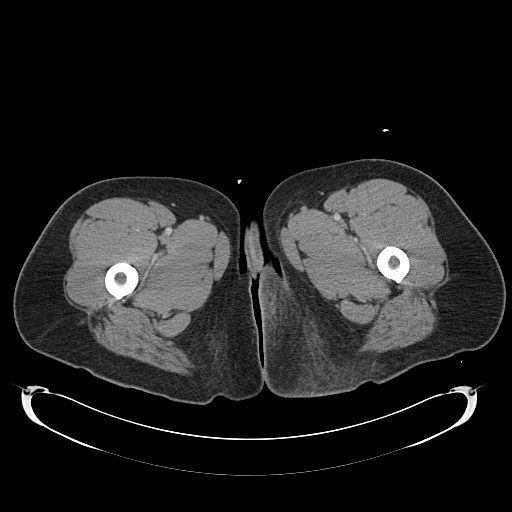
[im 22/68  soft-tissue]
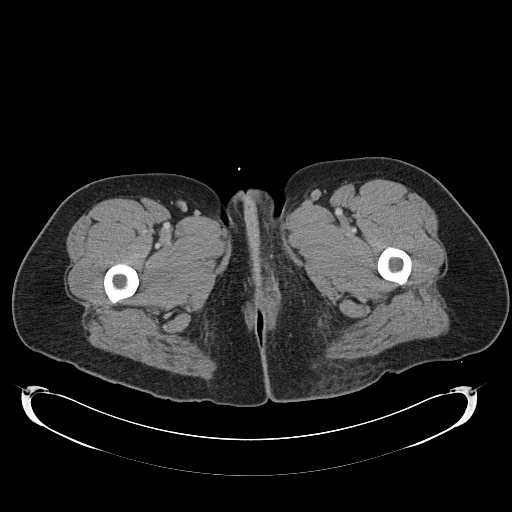
[im 26/68  soft-tissue]
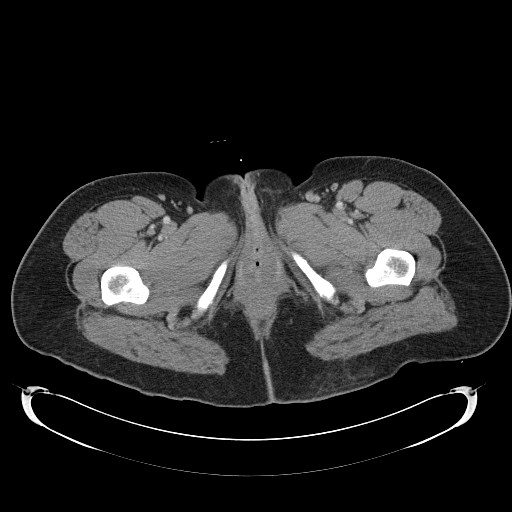
[im 31/68  soft-tissue]
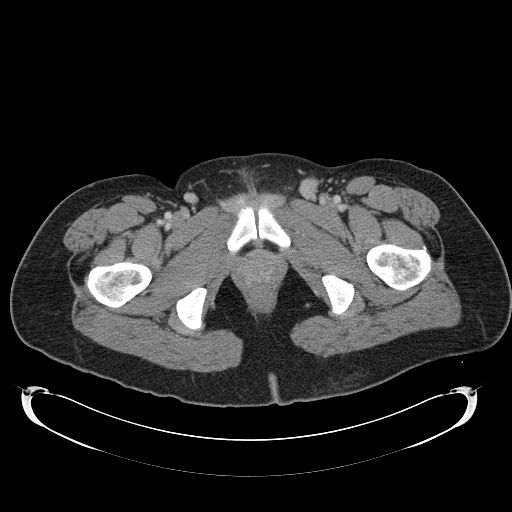
[im 37/68  soft-tissue]
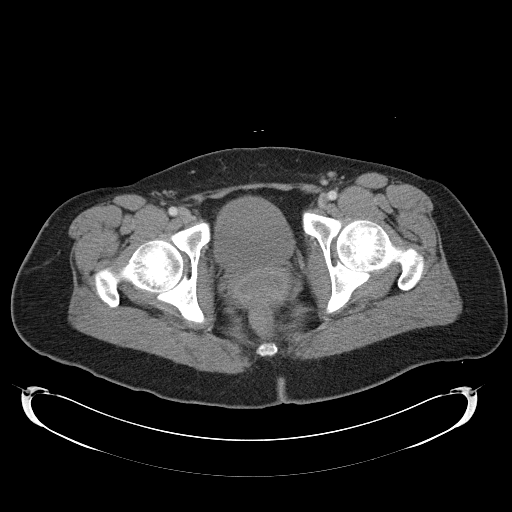
[im 42/68  soft-tissue]
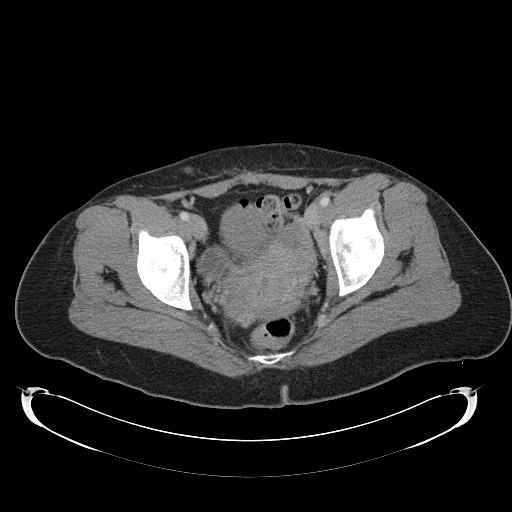
[im 42/68  bone]
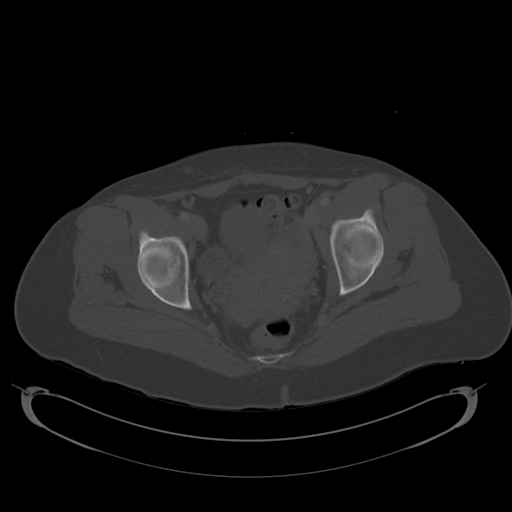
[im 46/68  soft-tissue]
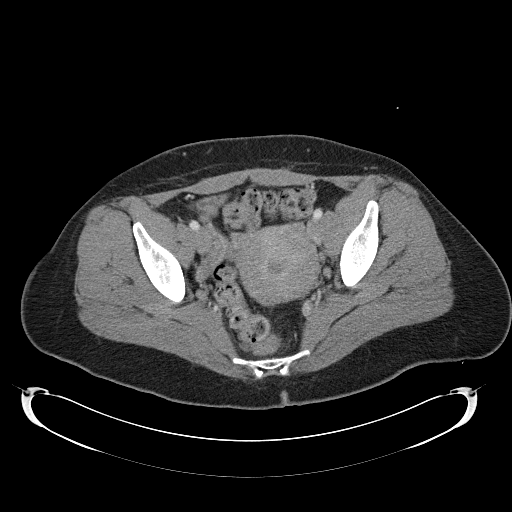
[im 50/68  soft-tissue]
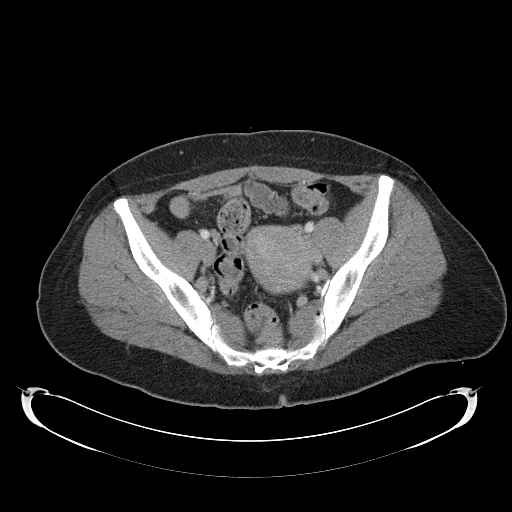
[im 55/68  soft-tissue]
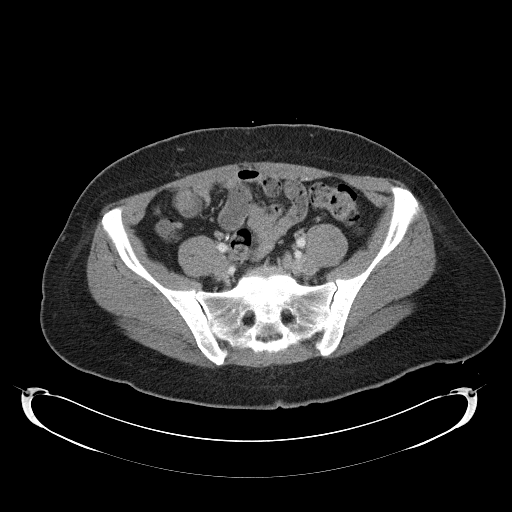
[im 59/68  soft-tissue]
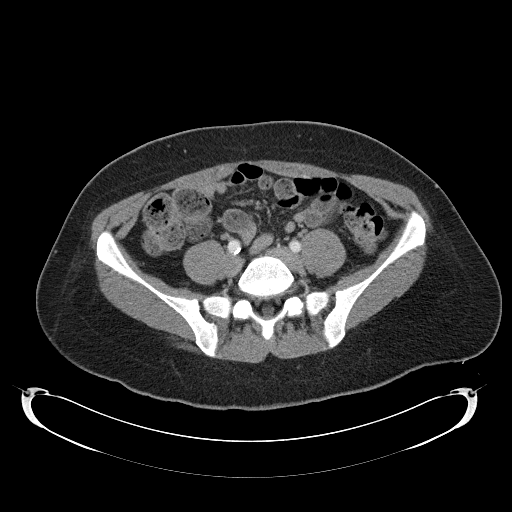
[im 63/68  soft-tissue]
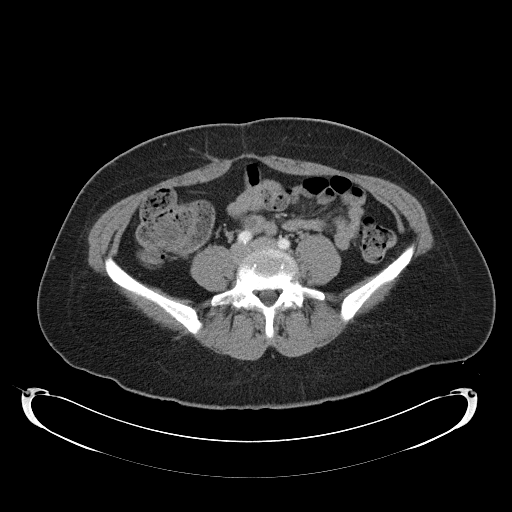

[Series 604: <mpr range(1)> · coronal · 0.87mm/px · 3 of 104 slices shown]
[im 35/104  soft-tissue]
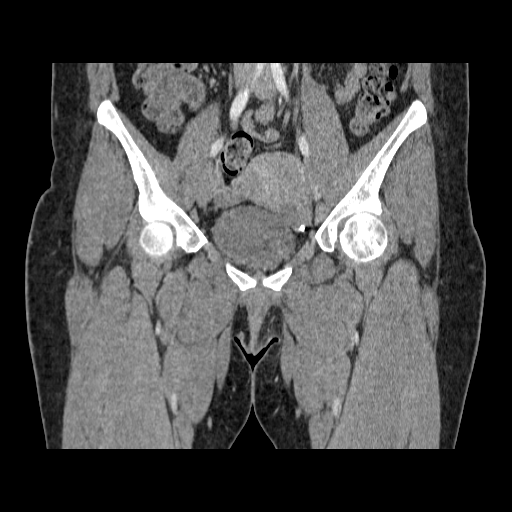
[im 46/104  soft-tissue]
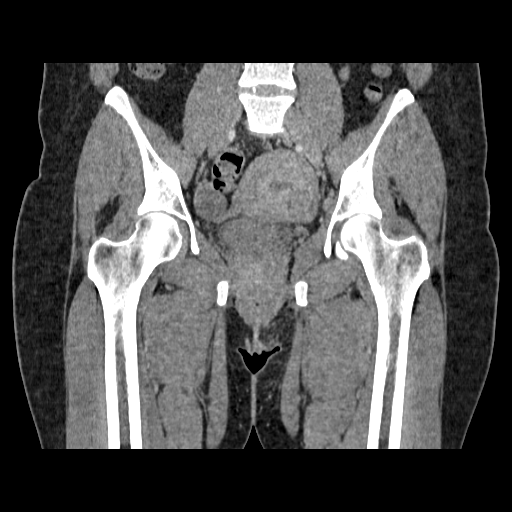
[im 58/104  soft-tissue]
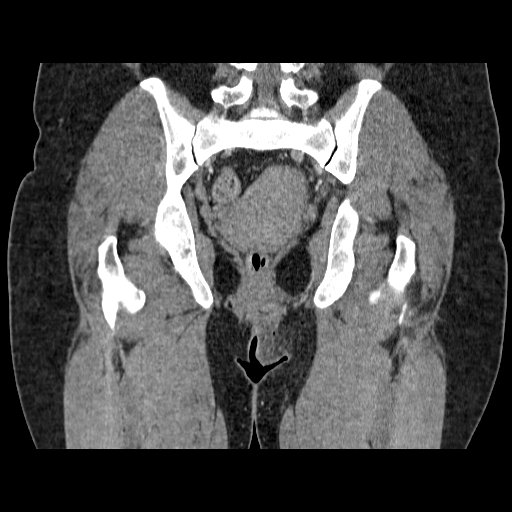

[17 of 46 positions shown; findings below may reference images not displayed]

FINDINGS: There is infiltration or edema in the subcutaneous fat
over the left peroneal  region extending to the inferior left
gluteal regions.  A tiny gas bubble is identified anteriorly.  No
discrete fluid collection to suggest abscess.  Mild prominence of
lymph nodes in the groin bilaterally may represent reactive nodes.
Pelvic organs appear unremarkable.  Surgical clips consistent with
tubal ligations.  Bones appear intact.  No evidence of focal bone
erosion or sclerosis.
IMPRESSION: Left medial peroneal and inferior gluteal subcutaneous fatty
infiltration suggesting cellulitis.  Suggestion of a tiny gas
bubble focally.  No fluid collection to suggest abscess.  No
fistulas demonstrated.

## 2014-05-19 ENCOUNTER — Other Ambulatory Visit: Payer: Self-pay | Admitting: Physician Assistant

## 2014-05-19 DIAGNOSIS — R490 Dysphonia: Secondary | ICD-10-CM

## 2014-05-24 ENCOUNTER — Ambulatory Visit
Admission: RE | Admit: 2014-05-24 | Discharge: 2014-05-24 | Disposition: A | Discharge status: Home / Self Care | Payer: Medicaid Other | Source: Ambulatory Visit | Attending: Physician Assistant | Admitting: Physician Assistant

## 2014-05-24 DIAGNOSIS — R490 Dysphonia: Secondary | ICD-10-CM

## 2014-05-25 ENCOUNTER — Other Ambulatory Visit: Payer: Medicaid Other

## 2014-06-12 ENCOUNTER — Other Ambulatory Visit (HOSPITAL_COMMUNITY): Payer: Self-pay | Admitting: Otolaryngology

## 2014-06-12 DIAGNOSIS — R1310 Dysphagia, unspecified: Secondary | ICD-10-CM

## 2014-06-14 ENCOUNTER — Other Ambulatory Visit (HOSPITAL_COMMUNITY): Payer: Self-pay | Admitting: Otolaryngology

## 2014-06-14 DIAGNOSIS — R1310 Dysphagia, unspecified: Secondary | ICD-10-CM

## 2014-06-26 ENCOUNTER — Ambulatory Visit (HOSPITAL_COMMUNITY): Admission: RE | Admit: 2014-06-26 | Payer: Medicaid Other | Source: Ambulatory Visit

## 2014-06-26 ENCOUNTER — Other Ambulatory Visit (HOSPITAL_COMMUNITY): Payer: Medicaid Other

## 2014-07-10 ENCOUNTER — Inpatient Hospital Stay (HOSPITAL_COMMUNITY)
Admission: AD | Admit: 2014-07-10 | Discharge: 2014-07-10 | Disposition: A | Discharge status: Home / Self Care | Payer: Medicaid Other | Source: Ambulatory Visit | Attending: Obstetrics and Gynecology | Admitting: Obstetrics and Gynecology

## 2014-07-10 ENCOUNTER — Encounter (HOSPITAL_COMMUNITY): Payer: Self-pay | Admitting: *Deleted

## 2014-07-10 DIAGNOSIS — Z7722 Contact with and (suspected) exposure to environmental tobacco smoke (acute) (chronic): Secondary | ICD-10-CM | POA: Diagnosis not present

## 2014-07-10 DIAGNOSIS — E119 Type 2 diabetes mellitus without complications: Secondary | ICD-10-CM | POA: Diagnosis not present

## 2014-07-10 DIAGNOSIS — Z79891 Long term (current) use of opiate analgesic: Secondary | ICD-10-CM | POA: Diagnosis not present

## 2014-07-10 DIAGNOSIS — F329 Major depressive disorder, single episode, unspecified: Secondary | ICD-10-CM | POA: Diagnosis not present

## 2014-07-10 DIAGNOSIS — N938 Other specified abnormal uterine and vaginal bleeding: Secondary | ICD-10-CM | POA: Insufficient documentation

## 2014-07-10 DIAGNOSIS — Z791 Long term (current) use of non-steroidal anti-inflammatories (NSAID): Secondary | ICD-10-CM | POA: Diagnosis not present

## 2014-07-10 DIAGNOSIS — Z975 Presence of (intrauterine) contraceptive device: Secondary | ICD-10-CM | POA: Diagnosis not present

## 2014-07-10 DIAGNOSIS — Z794 Long term (current) use of insulin: Secondary | ICD-10-CM | POA: Diagnosis not present

## 2014-07-10 HISTORY — DX: Hypothyroidism, unspecified: E03.9

## 2014-07-10 LAB — CBC
HCT: 40.1 % (ref 36.0–46.0)
HEMOGLOBIN: 14.5 g/dL (ref 12.0–15.0)
MCH: 30.7 pg (ref 26.0–34.0)
MCHC: 36.2 g/dL — ABNORMAL HIGH (ref 30.0–36.0)
MCV: 85 fL (ref 78.0–100.0)
PLATELETS: 221 10*3/uL (ref 150–400)
RBC: 4.72 MIL/uL (ref 3.87–5.11)
RDW: 12.2 % (ref 11.5–15.5)
WBC: 6 10*3/uL (ref 4.0–10.5)

## 2014-07-10 MED ORDER — IBUPROFEN 600 MG PO TABS
600.0000 mg | ORAL_TABLET | Freq: Once | ORAL | Status: AC
  Administered 2014-07-10: 600 mg via ORAL
  Filled 2014-07-10: qty 1

## 2014-07-10 NOTE — MAU Note (Signed)
IUD placed a month ago, (11/20).  Irregular bleeding since August, tried IUD to regulate periods.  Pt has bled since IUD placed, has been heavy @ times, passing clots @ times.  Finished course of premarin on Friday.

## 2014-07-10 NOTE — MAU Provider Note (Signed)
History     CSN: [id number]  Arrival date and time: 07/10/14 1609   None     Chief Complaint  Patient presents with  . Vaginal Bleeding   HPI This is a 43 y.o. female who presents with c/o heavy bleeding since August. Had a Mirena IUD placed in November to control the bleeding. Continues to have heavy bleeding and passes large clots   Finished a course of Premarin last week. States office sent her here.  States Hemoglobin was around 15 at her physical in the fall. States has diabetes and her blood sugar has been in the 500s   On Metformin and Lantus. [facility].   RN note:  Expand All Collapse All   IUD placed a month ago, (11/20). Irregular bleeding since August, tried IUD to regulate periods. Pt has bled since IUD placed, has been heavy @ times, passing clots @ times. Finished course of premarin on Friday.           OB History    No data available      Past Medical History  Diagnosis Date  . Diabetes mellitus   . Depression   . Perirectal abscess     Past Surgical History  Procedure Laterality Date  . Patella release and manipulation    . Tonsillectomy      at 43 years of age  . Incision and drainage perirectal abscess  02/26/2012    Procedure: IRRIGATION AND DEBRIDEMENT PERIRECTAL ABSCESS;  Surgeon: Kandis Cockingavid H Newman, MD;  [facility];  Service: General;  Laterality: Left;  . Cesarean section  12/21/2008  . Breast lumpectomy      at age 43    Family History  Problem Relation Age of Onset  . Hypertension Mother   . Lung cancer Mother   . Cancer Mother     stage 4 lung     History  Substance Use Topics  . Smoking status: Passive Smoke Exposure - Never Smoker  . Smokeless tobacco: Never Used  . Alcohol Use: Yes     Comment: occasional     Allergies: No Known Allergies  Prescriptions prior to admission  Medication Sig Dispense Refill Last Dose  . buPROPion (WELLBUTRIN XL) 150 MG 24 hr tablet Take 150 mg by mouth daily.    02/24/2012 at Unknown  . citalopram (CELEXA) 40 MG tablet Take 40 mg by mouth daily.   02/24/2012 at Unknown  . ibuprofen (ADVIL,MOTRIN) 600 MG tablet Take 1 tablet (600 mg total) by mouth every 6 (six) hours as needed for pain. 30 tablet 0   . insulin glargine (LANTUS) 100 UNIT/ML injection Inject 30 Units into the skin at bedtime. 10 mL 0   . insulin NPH-insulin regular (NOVOLIN 70/30) (70-30) 100 UNIT/ML injection Inject 3 Units into the skin.     . metFORMIN (GLUCOPHAGE) 1000 MG tablet Take 1,000 mg by mouth 2 (two) times daily with a meal.   02/24/2012 at Unknown  . sulfamethoxazole-trimethoprim (SEPTRA DS) 800-160 MG per tablet Take 1 tablet by mouth every 12 (twelve) hours. 14 tablet 0   . traMADol (ULTRAM) 50 MG tablet Take 1 tablet (50 mg total) by mouth every 6 (six) hours as needed for pain. 15 tablet 0     Review of Systems  Constitutional: Negative for fever and chills.  Gastrointestinal: Positive for abdominal pain (uteirne cramping). Negative for nausea, vomiting, diarrhea and constipation.  Neurological: Negative for dizziness and headaches.   Physical Exam   Blood  pressure 124/84, pulse 82, temperature 98.6 F (37 C), temperature source Oral, resp. rate 18, last menstrual period 06/09/2014.  Physical Exam  Constitutional: She is oriented to person, place, and time. She appears well-developed and well-nourished. No distress.  HENT:  Head: Normocephalic.  Cardiovascular: Normal rate.   Respiratory: Effort normal.  GI: Soft.  Genitourinary: Vaginal discharge (blood) found.  Moderate blood  No active bleeding when supine  Musculoskeletal: Normal range of motion.  Neurological: She is alert and oriented to person, place, and time.  Skin: Skin is warm and dry.  Psychiatric: She has a normal mood and affect.    MAU Course  Procedures  MDM Results for orders placed or performed during the hospital encounter of 07/10/14 (from the past 24 hour(s))  CBC     Status: Abnormal    Collection Time: 07/10/14  5:57 PM  Result Value Ref Range   WBC 6.0 4.0 - 10.5 K/uL   RBC 4.72 3.87 - 5.11 MIL/uL   Hemoglobin 14.5 12.0 - 15.0 g/dL   HCT 16.140.1 09.636.0 - 04.546.0 %   MCV 85.0 78.0 - 100.0 fL   MCH 30.7 26.0 - 34.0 pg   MCHC 36.2 (H) 30.0 - 36.0 g/dL   RDW 40.912.2 81.111.5 - 91.415.5 %   Platelets 221 150 - 400 K/uL   States last Hgb was 15  Assessment and Plan  A:  Dysfunctional Uterine Bleeding       Normal hemoglobin level  P:  Discussed with Dr Leia Alfallihan       Discharge home       Will call office in am to arrange followup         I suggested Ibuprofen for cramping  [facility] Center[facility]ILLIAMS,MARIE 07/10/2014, 5:36 PM

## 2014-07-10 NOTE — Discharge Instructions (Signed)
Abnormal Uterine Bleeding Abnormal uterine bleeding can affect women at various stages in life, including teenagers, women in their reproductive years, pregnant women, and women who have reached menopause. Several kinds of uterine bleeding are considered abnormal, including:  Bleeding or spotting between periods.   Bleeding after sexual intercourse.   Bleeding that is heavier or more than normal.   Periods that last longer than usual.  Bleeding after menopause.  Many cases of abnormal uterine bleeding are minor and simple to treat, while others are more serious. Any type of abnormal bleeding should be evaluated by your health care provider. Treatment will depend on the cause of the bleeding. HOME CARE INSTRUCTIONS Monitor your condition for any changes. The following actions may help to alleviate any discomfort you are experiencing:  Avoid the use of tampons and douches as directed by your health care provider.  Change your pads frequently. You should get regular pelvic exams and Pap tests. Keep all follow-up appointments for diagnostic tests as directed by your health care provider.  SEEK MEDICAL CARE IF:   Your bleeding lasts more than 1 week.   You feel dizzy at times.  SEEK IMMEDIATE MEDICAL CARE IF:   You pass out.   You are changing pads every 15 to 30 minutes.   You have abdominal pain.  You have a fever.   You become sweaty or weak.   You are passing large blood clots from the vagina.   You start to feel nauseous and vomit. MAKE SURE YOU:   Understand these instructions.  Will watch your condition.  Will get help right away if you are not doing well or get worse. Document Released: 07/07/2005 Document Revised: 07/12/2013 Document Reviewed: 02/03/2013 Exit[employer] Patient Information 2015 [employer]. This information is not intended to replace advice given to you by your health care provider. Make sure you discuss any questions you have with your  health care provider.  

## 2016-06-08 ENCOUNTER — Emergency Department (HOSPITAL_BASED_OUTPATIENT_CLINIC_OR_DEPARTMENT_OTHER)
Admission: EM | Admit: 2016-06-08 | Discharge: 2016-06-08 | Disposition: A | Discharge status: Home / Self Care | Payer: Medicaid Other | Attending: Emergency Medicine | Admitting: Emergency Medicine

## 2016-06-08 ENCOUNTER — Encounter (HOSPITAL_BASED_OUTPATIENT_CLINIC_OR_DEPARTMENT_OTHER): Payer: Self-pay | Admitting: Emergency Medicine

## 2016-06-08 DIAGNOSIS — E039 Hypothyroidism, unspecified: Secondary | ICD-10-CM | POA: Diagnosis not present

## 2016-06-08 DIAGNOSIS — Z7722 Contact with and (suspected) exposure to environmental tobacco smoke (acute) (chronic): Secondary | ICD-10-CM | POA: Diagnosis not present

## 2016-06-08 DIAGNOSIS — Z794 Long term (current) use of insulin: Secondary | ICD-10-CM | POA: Diagnosis not present

## 2016-06-08 DIAGNOSIS — N390 Urinary tract infection, site not specified: Secondary | ICD-10-CM | POA: Insufficient documentation

## 2016-06-08 DIAGNOSIS — E1165 Type 2 diabetes mellitus with hyperglycemia: Secondary | ICD-10-CM | POA: Diagnosis not present

## 2016-06-08 DIAGNOSIS — R739 Hyperglycemia, unspecified: Secondary | ICD-10-CM

## 2016-06-08 LAB — URINALYSIS, ROUTINE W REFLEX MICROSCOPIC
BILIRUBIN URINE: NEGATIVE
Glucose, UA: >1000 mg/dL — AB
Hgb urine dipstick: NEGATIVE
Ketones, ur: 40 mg/dL — AB
NITRITE: POSITIVE — AB
PROTEIN: NEGATIVE mg/dL
SPECIFIC GRAVITY, URINE: 1.044 — AB (ref 1.005–1.030)
pH: 5.5 (ref 5.0–8.0)

## 2016-06-08 LAB — BASIC METABOLIC PANEL
ANION GAP: 8 (ref 5–15)
BUN: 16 mg/dL (ref 6–20)
CALCIUM: 8.7 mg/dL — AB (ref 8.9–10.3)
CO2: 23 mmol/L (ref 22–32)
Chloride: 101 mmol/L (ref 101–111)
Creatinine, Ser: 0.54 mg/dL (ref 0.44–1.00)
Glucose, Bld: 431 mg/dL — ABNORMAL HIGH (ref 65–99)
Potassium: 4 mmol/L (ref 3.5–5.1)
SODIUM: 132 mmol/L — AB (ref 135–145)

## 2016-06-08 LAB — CBC
HCT: 38.9 % (ref 36.0–46.0)
HEMOGLOBIN: 14.1 g/dL (ref 12.0–15.0)
MCH: 30.3 pg (ref 26.0–34.0)
MCHC: 36.2 g/dL — ABNORMAL HIGH (ref 30.0–36.0)
MCV: 83.5 fL (ref 78.0–100.0)
Platelets: 223 10*3/uL (ref 150–400)
RBC: 4.66 MIL/uL (ref 3.87–5.11)
RDW: 11.8 % (ref 11.5–15.5)
WBC: 7.8 10*3/uL (ref 4.0–10.5)

## 2016-06-08 LAB — URINE MICROSCOPIC-ADD ON

## 2016-06-08 LAB — PREGNANCY, URINE: Preg Test, Ur: NEGATIVE

## 2016-06-08 LAB — CBG MONITORING, ED
GLUCOSE-CAPILLARY: 457 mg/dL — AB (ref 65–99)
Glucose-Capillary: 283 mg/dL — ABNORMAL HIGH (ref 65–99)

## 2016-06-08 MED ORDER — CEPHALEXIN 500 MG PO CAPS: 500.0000 mg | ORAL_CAPSULE | Freq: Three times a day (TID) | ORAL | 0 refills | Status: AC

## 2016-06-08 MED ORDER — SODIUM CHLORIDE 0.9 % IV BOLUS (SEPSIS)
1000.0000 mL | Freq: Once | INTRAVENOUS | Status: AC
  Administered 2016-06-08: 1000 mL via INTRAVENOUS

## 2016-06-08 MED ORDER — DEXTROSE 5 % IV SOLN: 1.0000 g | Freq: Once | INTRAVENOUS | Status: DC

## 2016-06-08 NOTE — ED Triage Notes (Addendum)
Patient reports uncontrolled glucose levels and states that glands are swollen in her neck and it hurts when she turns her neck.  Reports average blood sugars are high around 230.  States that it is not unusual for her to have blood glucose of 500 but states that she has been unable to get this down at home today.  States last blood glucose at home was 428 this morning prior to meals.  States she currently feels "foggy".  Reports polyuria, polydipsia but states this is normal for her.  Reports also headache and the gland swelling is on the right side of her head.

## 2016-06-08 NOTE — ED Provider Notes (Signed)
[facility] Provider Note   CSN: [id number] Arrival date & time: 06/08/16  1356  By signing my name below, I, Arianna Nassar, attest that this documentation has been prepared under the direction and in the presence of Alvira MondayErin Gaelan Glennon, MD.  Electronically Signed: Octavia HeirArianna Nassar, ED Scribe. 06/08/16. 3:51 PM.    History   Chief Complaint Chief Complaint  Patient presents with  . Hyperglycemia    The history is provided by the patient. No language interpreter was used.   HPI Comments: [name] is a 45 y.o. female who has a PMhx of DM, depression, hypothyroidism presents to the Emergency Department complaining of hyperglycemia x a few days. Pt reports having the lymph node swelling and pain in side of neck x 3 days as well as a headache and lower suprapubic abdominal tenderness which is normal when her blood sugar is high.  She says her blood sugar has been uncontrolled recently but that is baseline. She reports her "normal low" is around 230 and her sugars have not been lower than 430. She is currently complaint with her Metformin 1000 mg BID. Denies rhinorrhea, fever, sore throat, cough, chest pain, shortness of breath, urinary symptoms, nausea, vomiting, flank pain or back pain.  Past Medical History:  Diagnosis Date  . Depression   . Diabetes mellitus   . Hypothyroidism   . Perirectal abscess     Patient Active Problem List   Diagnosis Date Noted  . Yeast infection 02/27/2012  . Cellulitis of buttock 02/25/2012  . DM (diabetes mellitus), type 2, uncontrolled ([facility]) 02/25/2012    Past Surgical History:  Procedure Laterality Date  . BREAST LUMPECTOMY     at age 89  . CESAREAN SECTION  12/21/2008  . INCISION AND DRAINAGE PERIRECTAL ABSCESS  02/26/2012   Procedure: IRRIGATION AND DEBRIDEMENT PERIRECTAL ABSCESS;  Surgeon: Kandis Cockingavid H Newman, MD;  [facility];  Service: General;  Laterality: Left;  . PATELLA RELEASE AND MANIPULATION    . THYROID SURGERY    .  TONSILLECTOMY     at 45 years of age  . TUBAL LIGATION  2010    OB History    Gravida Para Term Preterm AB Living   4 2 1 1 2 2    SAB TAB Ectopic Multiple Live Births   1 1             Home Medications    Prior to Admission medications   Medication Sig Start Date End Date Taking? Authorizing Provider  buPROPion (WELLBUTRIN XL) 150 MG 24 hr tablet Take 150 mg by mouth daily.    Historical Provider, MD  cephALEXin (KEFLEX) 500 MG capsule Take 1 capsule (500 mg total) by mouth 3 (three) times daily. 06/08/16 06/15/16  Alvira MondayErin Liylah Najarro, MD  citalopram (CELEXA) 40 MG tablet Take 40 mg by mouth daily.    Historical Provider, MD  ibuprofen (ADVIL,MOTRIN) 600 MG tablet Take 1 tablet (600 mg total) by mouth every 6 (six) hours as needed for pain. Patient not taking: Reported on 07/10/2014 09/22/12   Loren Raceravid Yelverton, MD  insulin glargine (LANTUS) 100 UNIT/ML injection Inject 30 Units into the skin at bedtime. Patient taking differently: Inject 30 Units into the skin at bedtime. Per patient information- 30 units at bedtime with 2 units added if blood sugar level is greater than 320. 02/29/12 02/28/13  Hollice EspySendil K Krishnan, MD  insulin NPH-insulin regular (NOVOLIN 70/30) (70-30) 100 UNIT/ML injection Inject 3 Units into the skin.    Historical Provider,  MD  metFORMIN (GLUCOPHAGE) 1000 MG tablet Take 1,000 mg by mouth 2 (two) times daily with a meal.    Historical Provider, MD  sulfamethoxazole-trimethoprim (SEPTRA DS) 800-160 MG per tablet Take 1 tablet by mouth every 12 (twelve) hours. Patient not taking: Reported on 07/10/2014 09/22/12   Loren Raceravid Yelverton, MD  traMADol (ULTRAM) 50 MG tablet Take 1 tablet (50 mg total) by mouth every 6 (six) hours as needed for pain. Patient not taking: Reported on 07/10/2014 09/22/12   Loren Raceravid Yelverton, MD    Family History Family History  Problem Relation Age of Onset  . Hypertension Mother   . Lung cancer Mother   . Cancer Mother     stage 4 lung     Social  History Social History  Substance Use Topics  . Smoking status: Passive Smoke Exposure - Never Smoker  . Smokeless tobacco: Never Used  . Alcohol use Yes     Comment: occasional      Allergies   Patient has no known allergies.   Review of Systems Review of Systems  Constitutional: Positive for fatigue. Negative for fever.  HENT: Negative for sore throat.   Eyes: Negative for visual disturbance.  Respiratory: Negative for shortness of breath.   Cardiovascular: Negative for chest pain.  Gastrointestinal: Negative for abdominal pain, diarrhea, nausea and vomiting.  Genitourinary: Negative for dysuria, frequency, hematuria and urgency.  Musculoskeletal: Positive for neck pain. Negative for back pain.  Skin: Negative for rash.  Neurological: Positive for headaches.     Physical Exam Updated Vital Signs BP 113/82 (BP Location: Left Arm)   Pulse 80   Temp 98.4 F (36.9 C) (Oral)   Resp 18   Ht 5\' 6"  (1.676 m)   Wt 138 lb (62.6 kg)   LMP 05/25/2016 (Approximate)   SpO2 97%   BMI 22.27 kg/m   Physical Exam  Constitutional: She is oriented to person, place, and time. She appears well-developed and well-nourished. No distress.  HENT:  Head: Normocephalic and atraumatic.  Mouth/Throat: No oropharyngeal exudate.  Eyes: Conjunctivae and EOM are normal.  Neck: Normal range of motion.  Palpable lymphnodes and cervical lymphadenopathy that are small and mobile  Cardiovascular: Normal rate, regular rhythm, normal heart sounds and intact distal pulses.  Exam reveals no gallop and no friction rub.   No murmur heard. Pulmonary/Chest: Effort normal and breath sounds normal. No respiratory distress. She has no wheezes. She has no rales.  Abdominal: Soft. She exhibits no distension. There is tenderness. There is no guarding.  Suprapubic tenderness  Musculoskeletal: Normal range of motion. She exhibits no edema or tenderness.  Neurological: She is alert and oriented to person, place,  and time.  Skin: Skin is warm and dry. No rash noted. She is not diaphoretic. No erythema.  Psychiatric: She has a normal mood and affect.  Nursing note and vitals reviewed.    ED Treatments / Results  DIAGNOSTIC STUDIES: Oxygen Saturation is 99% on RA, normal by my interpretation.  COORDINATION OF CARE:  3:47 PM Discussed treatment plan with pt at bedside and pt agreed to plan.  Labs (all labs ordered are listed, but only abnormal results are displayed) Labs Reviewed  BASIC METABOLIC PANEL - Abnormal; Notable for the following:       Result Value   Sodium 132 (*)    Glucose, Bld 431 (*)    Calcium 8.7 (*)    All other components within normal limits  CBC - Abnormal; Notable for the following:  MCHC 36.2 (*)    All other components within normal limits  URINALYSIS, ROUTINE W REFLEX MICROSCOPIC (NOT AT [facility]) - Abnormal; Notable for the following:    APPearance CLOUDY (*)    Specific Gravity, Urine 1.044 (*)    Glucose, UA >1000 (*)    Ketones, ur 40 (*)    Nitrite POSITIVE (*)    Leukocytes, UA MODERATE (*)    All other components within normal limits  URINE MICROSCOPIC-ADD ON - Abnormal; Notable for the following:    Squamous Epithelial / LPF 0-5 (*)    Bacteria, UA MANY (*)    All other components within normal limits  CBG MONITORING, ED - Abnormal; Notable for the following:    Glucose-Capillary 457 (*)    All other components within normal limits  CBG MONITORING, ED - Abnormal; Notable for the following:    Glucose-Capillary 283 (*)    All other components within normal limits  PREGNANCY, URINE    EKG  EKG Interpretation None       Radiology No results found.  Procedures Procedures (including critical care time)  Medications Ordered in ED Medications  sodium chloride 0.9 % bolus 1,000 mL (0 mLs Intravenous Stopped 06/08/16 1618)     Initial Impression / Assessment and Plan / ED Course  I have reviewed the triage vital signs and the nursing  notes.  Pertinent labs & imaging results that were available during my care of the patient were reviewed by me and considered in my medical decision making (see chart for details).  Clinical Course    45 year old female with history of diabetes on metformin presents with concern for hyperglycemia and right cervical lymphadenopathy. Patient has shoddy lymphadenopathy on exam a small mobile lymph nodes, no signs of abscess, no sore throat, low suspicion for strep, mono, PTA, RPA, mandibular osteomyelitis or other. Lymph nodes prior reacted to something, however unclear cause at this time, recommend continuing to follow the area, and if she continues to have swelling to follow up with her primary care doctor in 2-3 weeks. Regarding regarding her hyperglycemia, she has no signs of DKA. Urinalysis shows urinary tract infection, which is likely contributing to her hyperglycemia. We'll treat with 1 week of Keflex. Recommend close PCP follow-up regarding glucose management. Patient's glucose 431, she received a liter of normal saline, recheck was 283. Patient discharged in stable condition with understanding of reasons to return.   I personally performed the services described in this documentation, which was scribed in my presence. The recorded information has been reviewed and is accurate.  Final Clinical Impressions(s) / ED Diagnoses   Final diagnoses:  Hyperglycemia  Urinary tract infection without hematuria, site unspecified    New Prescriptions New Prescriptions   CEPHALEXIN (KEFLEX) 500 MG CAPSULE    Take 1 capsule (500 mg total) by mouth 3 (three) times daily.     Alvira Monday, MD 06/08/16 1620

## 2016-06-08 NOTE — ED Notes (Signed)
ED Provider at bedside. 

## 2016-07-15 ENCOUNTER — Emergency Department (HOSPITAL_BASED_OUTPATIENT_CLINIC_OR_DEPARTMENT_OTHER)
Admission: EM | Admit: 2016-07-15 | Discharge: 2016-07-15 | Disposition: A | Discharge status: Home / Self Care | Payer: Medicaid Other | Attending: Emergency Medicine | Admitting: Emergency Medicine

## 2016-07-15 ENCOUNTER — Emergency Department (HOSPITAL_BASED_OUTPATIENT_CLINIC_OR_DEPARTMENT_OTHER): Payer: Medicaid Other

## 2016-07-15 ENCOUNTER — Encounter (HOSPITAL_BASED_OUTPATIENT_CLINIC_OR_DEPARTMENT_OTHER): Payer: Self-pay | Admitting: Emergency Medicine

## 2016-07-15 DIAGNOSIS — Z7722 Contact with and (suspected) exposure to environmental tobacco smoke (acute) (chronic): Secondary | ICD-10-CM | POA: Diagnosis not present

## 2016-07-15 DIAGNOSIS — E119 Type 2 diabetes mellitus without complications: Secondary | ICD-10-CM | POA: Insufficient documentation

## 2016-07-15 DIAGNOSIS — K047 Periapical abscess without sinus: Secondary | ICD-10-CM

## 2016-07-15 DIAGNOSIS — E039 Hypothyroidism, unspecified: Secondary | ICD-10-CM | POA: Insufficient documentation

## 2016-07-15 DIAGNOSIS — Z794 Long term (current) use of insulin: Secondary | ICD-10-CM | POA: Diagnosis not present

## 2016-07-15 DIAGNOSIS — R220 Localized swelling, mass and lump, head: Secondary | ICD-10-CM | POA: Diagnosis present

## 2016-07-15 LAB — BASIC METABOLIC PANEL
Anion gap: 6 (ref 5–15)
BUN: 11 mg/dL (ref 6–20)
CALCIUM: 8.7 mg/dL — AB (ref 8.9–10.3)
CO2: 25 mmol/L (ref 22–32)
Chloride: 101 mmol/L (ref 101–111)
Creatinine, Ser: 0.41 mg/dL — ABNORMAL LOW (ref 0.44–1.00)
GFR calc Af Amer: >60 mL/min (ref >60)
GLUCOSE: 383 mg/dL — AB (ref 65–99)
Potassium: 4.3 mmol/L (ref 3.5–5.1)
Sodium: 132 mmol/L — ABNORMAL LOW (ref 135–145)

## 2016-07-15 LAB — CBC
HCT: 37.4 % (ref 36.0–46.0)
Hemoglobin: 13.3 g/dL (ref 12.0–15.0)
MCH: 30 pg (ref 26.0–34.0)
MCHC: 35.6 g/dL (ref 30.0–36.0)
MCV: 84.4 fL (ref 78.0–100.0)
PLATELETS: 195 10*3/uL (ref 150–400)
RBC: 4.43 MIL/uL (ref 3.87–5.11)
RDW: 11.7 % (ref 11.5–15.5)
WBC: 6.1 10*3/uL (ref 4.0–10.5)

## 2016-07-15 MED ORDER — PENICILLIN V POTASSIUM 500 MG PO TABS: 500.0000 mg | ORAL_TABLET | Freq: Four times a day (QID) | ORAL | 0 refills | Status: AC

## 2016-07-15 MED ORDER — IOPAMIDOL (ISOVUE-300) INJECTION 61%
100.0000 mL | Freq: Once | INTRAVENOUS | Status: AC | PRN
  Administered 2016-07-15: 75 mL via INTRAVENOUS

## 2016-07-15 MED ORDER — SODIUM CHLORIDE 0.9 % IV BOLUS (SEPSIS): 1000.0000 mL | Freq: Once | INTRAVENOUS | Status: DC

## 2016-07-15 MED FILL — PENICILLIN VK 500 MG TABLET: 500 | 10 days supply | Qty: 40 | Fill #0

## 2016-07-15 NOTE — ED Notes (Signed)
Pt refusing IVFs d/t she needs to leave to get to her son; MD notified.

## 2016-07-15 NOTE — Discharge Instructions (Signed)
Return to the ED with any concerns including difficulty breathing or swallowing, vomiting and not able to keep down antibiotics, decreased level of alertness/lethargy, or any other alarming symptoms °

## 2016-07-15 NOTE — ED Provider Notes (Signed)
[facility] Provider Note   CSN: [id number] Arrival date & time: 07/15/16  1026     History   Chief Complaint Chief Complaint  Patient presents with  . Facial Pain    HPI [name] is a 45 y.o. female.  HPI  Pt presenting with c/o facial swelling on left.  She states the swelling has been coming and going for the past several days, today she felt the swelling was up under her eye more.  She denies fever.  She denies dental pain.  Pain is next to her nose and overlying left maxillary sinus.  She has not had chronic sinus issues.  She does have some congestion now.  Has tried afrin.  Pt has diabetes, blood sugar this morning was 238 which she states is good for her usual.  There are no other associated systemic symptoms, there are no other alleviating or modifying factors.   Past Medical History:  Diagnosis Date  . Depression   . Diabetes mellitus   . Hypothyroidism   . Perirectal abscess     Patient Active Problem List   Diagnosis Date Noted  . Yeast infection 02/27/2012  . Cellulitis of buttock 02/25/2012  . DM (diabetes mellitus), type 2, uncontrolled ([facility]) 02/25/2012    Past Surgical History:  Procedure Laterality Date  . BREAST LUMPECTOMY     at age 45  . CESAREAN SECTION  12/21/2008  . INCISION AND DRAINAGE PERIRECTAL ABSCESS  02/26/2012   Procedure: IRRIGATION AND DEBRIDEMENT PERIRECTAL ABSCESS;  Surgeon: Kandis Cockingavid H Newman, MD;  [facility];  Service: General;  Laterality: Left;  . PATELLA RELEASE AND MANIPULATION    . THYROID SURGERY    . TONSILLECTOMY     at 45 years of age  . TUBAL LIGATION  2010    OB History    Gravida Para Term Preterm AB Living   4 2 1 1 2 2    SAB TAB Ectopic Multiple Live Births   1 1             Home Medications    Prior to Admission medications   Medication Sig Start Date End Date Taking? Authorizing Provider  buPROPion (WELLBUTRIN XL) 150 MG 24 hr tablet Take 150 mg by mouth daily.    Historical  Provider, MD  citalopram (CELEXA) 40 MG tablet Take 40 mg by mouth daily.    Historical Provider, MD  ibuprofen (ADVIL,MOTRIN) 600 MG tablet Take 1 tablet (600 mg total) by mouth every 6 (six) hours as needed for pain. Patient not taking: Reported on 07/10/2014 09/22/12   Loren Raceravid Yelverton, MD  insulin glargine (LANTUS) 100 UNIT/ML injection Inject 30 Units into the skin at bedtime. Patient taking differently: Inject 30 Units into the skin at bedtime. Per patient information- 30 units at bedtime with 2 units added if blood sugar level is greater than 320. 02/29/12 02/28/13  Hollice EspySendil K Krishnan, MD  insulin NPH-insulin regular (NOVOLIN 70/30) (70-30) 100 UNIT/ML injection Inject 3 Units into the skin.    Historical Provider, MD  metFORMIN (GLUCOPHAGE) 1000 MG tablet Take 1,000 mg by mouth 2 (two) times daily with a meal.    Historical Provider, MD  penicillin v potassium (VEETID) 500 MG tablet Take 1 tablet (500 mg total) by mouth 4 (four) times daily. 07/15/16 07/25/16  Jerelyn ScottMartha Linker, MD  sulfamethoxazole-trimethoprim (SEPTRA DS) 800-160 MG per tablet Take 1 tablet by mouth every 12 (twelve) hours. Patient not taking: Reported on 07/10/2014 09/22/12   Onalee Huaavid  Ranae PalmsYelverton, MD  traMADol (ULTRAM) 50 MG tablet Take 1 tablet (50 mg total) by mouth every 6 (six) hours as needed for pain. Patient not taking: Reported on 07/10/2014 09/22/12   Loren Raceravid Yelverton, MD    Family History Family History  Problem Relation Age of Onset  . Hypertension Mother   . Lung cancer Mother   . Cancer Mother     stage 4 lung     Social History Social History  Substance Use Topics  . Smoking status: Passive Smoke Exposure - Never Smoker  . Smokeless tobacco: Never Used  . Alcohol use Yes     Comment: occasional      Allergies   Patient has no known allergies.   Review of Systems Review of Systems  ROS reviewed and all otherwise negative except for mentioned in HPI   Physical Exam Updated Vital Signs BP 105/67 (BP  Location: Left Arm)   Pulse 78   Temp 98.2 F (36.8 C) (Oral)   Resp 18   Ht 5\' 6"  (1.676 m)   Wt 138 lb 3.2 oz (62.7 kg)   LMP 07/10/2016   SpO2 100%   BMI 22.31 kg/m  Vitals reviewed Physical Exam Physical Examination: General appearance - alert, well appearing, and in no distress Mental status - alert, oriented to person, place, and time Eyes - PERRL, EOM intact and without pain, no conjunctival injection, no redness or swelling around eye Mouth - mucous membranes moist, pharynx normal without lesions, ttp at right upper gum line at junction with buccal mucosa, no discrete abcess palpable, no teeth tender to tapping Face- ttp over left cheek- overlying maxillary sinus, no fluctuance or erythema Neck - supple, no significant adenopathy Chest - clear to auscultation, no wheezes, rales or rhonchi, symmetric air entry Heart - normal rate, regular rhythm, normal S1, S2, no murmurs, rubs, clicks or gallops Abdomen - soft, nontender, nondistended, no masses or organomegaly Neurological - alert, oriented, normal speech, no focal findings or movement disorder noted Extremities - peripheral pulses normal, no pedal edema, no clubbing or cyanosis Skin - normal coloration and turgor  ED Treatments / Results  Labs (all labs ordered are listed, but only abnormal results are displayed) Labs Reviewed  BASIC METABOLIC PANEL - Abnormal; Notable for the following:       Result Value   Sodium 132 (*)    Glucose, Bld 383 (*)    Creatinine, Ser 0.41 (*)    Calcium 8.7 (*)    All other components within normal limits  CBC    EKG  EKG Interpretation None       Radiology Ct Maxillofacial W Contrast  Result Date: 07/15/2016 CLINICAL DATA:  Left facial swelling.  Pain in the left nostril. EXAM: CT MAXILLOFACIAL WITH CONTRAST TECHNIQUE: Multidetector CT imaging of the maxillofacial structures was performed. Multiplanar CT image reconstructions were also generated. A small metallic BB was  placed on the right temple in order to reliably differentiate right from left. COMPARISON:  None. FINDINGS: Osseous: Periapical lucency involving the left upper second bicuspid. There is a focal bone defect along the lateral aspect of the periapical lucency and there is adjacent soft tissue inflammation in this area. The left upper second bicuspid tooth is a very small and may have been partially removed or fractured in the past. There is a large periapical lucency involving the right lower first molar and there is a large caries along the posterior aspect of this tooth. Negative for facial fracture. Mandibular condyles  are located. Nasal bones are intact. Nasal septum is intact. Orbits: Normal appearance of the orbits and globes. Sinuses: Small amount of fluid in the right mastoid air cells. Paranasal sinuses are clear. Soft tissues: There is a small fluid and gas collection along the left side of the maxilla on sequence 2, image 35. This measures 2.3 x 0.5 cm. This is adjacent to the periapical lucency along the left maxilla and most likely secondary to odontogenic disease. There is also soft tissue swelling along the medial left cheek and lateral aspect of the left nose. Symmetric appearance of the submandibular glands. No gross abnormality to the parotid tissue. Normal appearance of the parapharyngeal fat. Limited intracranial: No significant or unexpected finding. IMPRESSION: Soft tissue abscess along the left side of the maxilla containing a small amount of gas and fluid. This abscess appears to be secondary to odontogenic disease involving the left upper second bicuspid tooth. Soft tissue swelling along the left side of the face related to the abscess and odontogenic disease. Odontogenic disease involving the right lower first molar. Electronically Signed   By: Richarda Overlie M.D.   On: 07/15/2016 13:01    Procedures Procedures (including critical care time)  Medications Ordered in ED Medications  sodium  chloride 0.9 % bolus 1,000 mL (not administered)  iopamidol (ISOVUE-300) 61 % injection 100 mL (75 mLs Intravenous Contrast Given 07/15/16 1219)     Initial Impression / Assessment and Plan / ED Course  I have reviewed the triage vital signs and the nursing notes.  Pertinent labs & imaging results that were available during my care of the patient were reviewed by me and considered in my medical decision making (see chart for details).  Clinical Course     Pt with c/o facial swelling- no dental pain on exam.  CT shows odontogenic abscess- no involvement of the eye.  Pt has full EOM without pain.  Pt started on penicillin and given information for dental followup.  Discharged with strict return precautions.  Pt agreeable with plan.  Final Clinical Impressions(s) / ED Diagnoses   Final diagnoses:  Periapical abscess    New Prescriptions Discharge Medication List as of 07/15/2016  1:26 PM    START taking these medications   Details  penicillin v potassium (VEETID) 500 MG tablet Take 1 tablet (500 mg total) by mouth 4 (four) times daily., Starting Tue 07/15/2016, Until Fri 07/25/2016, Print         Jerelyn Scott, MD 07/15/16 [phone]

## 2016-07-15 NOTE — ED Triage Notes (Signed)
Pt c/o LT side facial pain, swelling; Noticed LT eye "looked funny" starting Wed, significantly worse this a.m. HX diabetes, poorly controlled.

## 2016-07-15 NOTE — ED Notes (Signed)
Pt directed to pharmacy to pick Rx. Declined work note

## 2019-04-08 ENCOUNTER — Encounter (HOSPITAL_BASED_OUTPATIENT_CLINIC_OR_DEPARTMENT_OTHER): Payer: Self-pay | Admitting: *Deleted

## 2019-04-08 ENCOUNTER — Emergency Department (HOSPITAL_BASED_OUTPATIENT_CLINIC_OR_DEPARTMENT_OTHER)
Admission: EM | Admit: 2019-04-08 | Discharge: 2019-04-08 | Disposition: A | Discharge status: Home / Self Care | Payer: HRSA Program | Attending: Emergency Medicine | Admitting: Emergency Medicine

## 2019-04-08 ENCOUNTER — Emergency Department (HOSPITAL_BASED_OUTPATIENT_CLINIC_OR_DEPARTMENT_OTHER): Payer: HRSA Program

## 2019-04-08 ENCOUNTER — Other Ambulatory Visit: Payer: Self-pay

## 2019-04-08 DIAGNOSIS — Z79899 Other long term (current) drug therapy: Secondary | ICD-10-CM | POA: Insufficient documentation

## 2019-04-08 DIAGNOSIS — Z7722 Contact with and (suspected) exposure to environmental tobacco smoke (acute) (chronic): Secondary | ICD-10-CM | POA: Diagnosis not present

## 2019-04-08 DIAGNOSIS — J1281 Pneumonia due to SARS-associated coronavirus: Secondary | ICD-10-CM | POA: Diagnosis not present

## 2019-04-08 DIAGNOSIS — U071 COVID-19: Secondary | ICD-10-CM | POA: Diagnosis not present

## 2019-04-08 DIAGNOSIS — J189 Pneumonia, unspecified organism: Secondary | ICD-10-CM

## 2019-04-08 DIAGNOSIS — E1165 Type 2 diabetes mellitus with hyperglycemia: Secondary | ICD-10-CM | POA: Insufficient documentation

## 2019-04-08 DIAGNOSIS — R739 Hyperglycemia, unspecified: Secondary | ICD-10-CM

## 2019-04-08 DIAGNOSIS — Z794 Long term (current) use of insulin: Secondary | ICD-10-CM | POA: Insufficient documentation

## 2019-04-08 DIAGNOSIS — E039 Hypothyroidism, unspecified: Secondary | ICD-10-CM | POA: Insufficient documentation

## 2019-04-08 DIAGNOSIS — R05 Cough: Secondary | ICD-10-CM | POA: Diagnosis present

## 2019-04-08 LAB — COMPREHENSIVE METABOLIC PANEL
ALT: 15 U/L (ref 0–44)
AST: 14 U/L — ABNORMAL LOW (ref 15–41)
Albumin: 3.3 g/dL — ABNORMAL LOW (ref 3.5–5.0)
Alkaline Phosphatase: 83 U/L (ref 38–126)
Anion gap: 18 — ABNORMAL HIGH (ref 5–15)
BUN: 8 mg/dL (ref 6–20)
CO2: 19 mmol/L — ABNORMAL LOW (ref 22–32)
Calcium: 8.7 mg/dL — ABNORMAL LOW (ref 8.9–10.3)
Chloride: 99 mmol/L (ref 98–111)
Creatinine, Ser: 0.58 mg/dL (ref 0.44–1.00)
GFR calc Af Amer: >60 mL/min (ref >60)
GFR calc non Af Amer: >60 mL/min (ref >60)
Glucose, Bld: 370 mg/dL — ABNORMAL HIGH (ref 70–99)
Potassium: 4.1 mmol/L (ref 3.5–5.1)
Sodium: 136 mmol/L (ref 135–145)
Total Bilirubin: 1.4 mg/dL — ABNORMAL HIGH (ref 0.3–1.2)
Total Protein: 7 g/dL (ref 6.5–8.1)

## 2019-04-08 LAB — CBC WITH DIFFERENTIAL/PLATELET
Abs Immature Granulocytes: 0.05 10*3/uL (ref 0.00–0.07)
Basophils Absolute: 0 10*3/uL (ref 0.0–0.1)
Basophils Relative: 0 %
Eosinophils Absolute: 0.1 10*3/uL (ref 0.0–0.5)
Eosinophils Relative: 1 %
HCT: 39.9 % (ref 36.0–46.0)
Hemoglobin: 13.8 g/dL (ref 12.0–15.0)
Immature Granulocytes: 1 %
Lymphocytes Relative: 16 %
Lymphs Abs: 1 10*3/uL (ref 0.7–4.0)
MCH: 29.9 pg (ref 26.0–34.0)
MCHC: 34.6 g/dL (ref 30.0–36.0)
MCV: 86.6 fL (ref 80.0–100.0)
Monocytes Absolute: 0.6 10*3/uL (ref 0.1–1.0)
Monocytes Relative: 10 %
Neutro Abs: 4.4 10*3/uL (ref 1.7–7.7)
Neutrophils Relative %: 72 %
Platelets: 147 10*3/uL — ABNORMAL LOW (ref 150–400)
RBC: 4.61 MIL/uL (ref 3.87–5.11)
RDW: 11.9 % (ref 11.5–15.5)
WBC: 6 10*3/uL (ref 4.0–10.5)
nRBC: 0 % (ref 0.0–0.2)

## 2019-04-08 LAB — SARS CORONAVIRUS 2 BY RT PCR (HOSPITAL ORDER, PERFORMED IN ~~LOC~~ HOSPITAL LAB): SARS Coronavirus 2: POSITIVE — AB

## 2019-04-08 MED ORDER — KETOROLAC TROMETHAMINE 30 MG/ML IJ SOLN
30.0000 mg | Freq: Once | INTRAMUSCULAR | Status: AC
  Administered 2019-04-08: 08:00:00 30 mg via INTRAVENOUS
  Filled 2019-04-08: qty 1

## 2019-04-08 MED ORDER — AEROCHAMBER PLUS FLO-VU MEDIUM MISC
1.0000 | Freq: Once | Status: AC
  Administered 2019-04-08: 08:00:00 1
  Filled 2019-04-08: qty 1

## 2019-04-08 MED ORDER — SODIUM CHLORIDE 0.9 % IV BOLUS
1000.0000 mL | Freq: Once | INTRAVENOUS | Status: AC
  Administered 2019-04-08: 08:00:00 1000 mL via INTRAVENOUS

## 2019-04-08 MED ORDER — AZITHROMYCIN 250 MG PO TABS
500.0000 mg | ORAL_TABLET | Freq: Once | ORAL | Status: AC
  Administered 2019-04-08: 500 mg via ORAL
  Filled 2019-04-08: qty 2

## 2019-04-08 MED ORDER — ALBUTEROL SULFATE HFA 108 (90 BASE) MCG/ACT IN AERS
4.0000 | INHALATION_SPRAY | Freq: Once | RESPIRATORY_TRACT | Status: AC
  Administered 2019-04-08: 08:00:00 4 via RESPIRATORY_TRACT
  Filled 2019-04-08: qty 6.7

## 2019-04-08 MED ORDER — AZITHROMYCIN 250 MG PO TABS: 250.0000 mg | ORAL_TABLET | Freq: Every day | ORAL | 0 refills | Status: DC

## 2019-04-08 MED ORDER — SODIUM CHLORIDE 0.9 % IV SOLN
1.0000 g | Freq: Once | INTRAVENOUS | Status: AC
  Administered 2019-04-08: 09:00:00 1 g via INTRAVENOUS
  Filled 2019-04-08: qty 10

## 2019-04-08 MED ORDER — HYDROCODONE-ACETAMINOPHEN 5-325 MG PO TABS: 1.0000 | ORAL_TABLET | ORAL | 0 refills | Status: DC | PRN

## 2019-04-08 MED ORDER — DEXAMETHASONE SODIUM PHOSPHATE 10 MG/ML IJ SOLN
10.0000 mg | Freq: Once | INTRAMUSCULAR | Status: AC
  Administered 2019-04-08: 10 mg via INTRAVENOUS
  Filled 2019-04-08: qty 1

## 2019-04-08 NOTE — Discharge Instructions (Addendum)
Use your insulin sliding scale as needed.

## 2019-04-08 NOTE — ED Triage Notes (Addendum)
C/o shortness of breath, cough, back pain and pain in left arm w cough  Greater than 1 week  Loss of taste

## 2019-04-08 NOTE — ED Provider Notes (Addendum)
[place] EMERGENCY DEPARTMENT Provider Note   CSN: [id number] Arrival date & time: 04/08/19  0735     History   Chief Complaint Chief Complaint  Patient presents with  . Shortness of Breath    HPI TEMIA DEBROUX is a 48 y.o. female.     Pt presents to the ED today with cough and not feeling well.  The pt said she's been sick for 7-10 days.  Her blood sugar was low yesterday (she's a diabetic).  She has been able to eat, but has no taste.  She gets dizzy when she walks.  She said sx started as allergies.  She's taken otc robitussin, zyrtec, allegra.  Nothing has helped.  No known covid exposures.  She works from home.     Past Medical History:  Diagnosis Date  . Depression   . Diabetes mellitus   . Hypothyroidism   . Perirectal abscess     Patient Active Problem List   Diagnosis Date Noted  . Yeast infection 02/27/2012  . Cellulitis of buttock 02/25/2012  . DM (diabetes mellitus), type 2, uncontrolled ([place]) 02/25/2012    Past Surgical History:  Procedure Laterality Date  . BREAST LUMPECTOMY     at age 6  . CESAREAN SECTION  12/21/2008  . INCISION AND DRAINAGE PERIRECTAL ABSCESS  02/26/2012   Procedure: IRRIGATION AND DEBRIDEMENT PERIRECTAL ABSCESS;  Surgeon: Shann Medal, MD;  Location: [place];  Service: General;  Laterality: Left;  . PATELLA RELEASE AND MANIPULATION    . THYROID SURGERY    . TONSILLECTOMY     at 48 years of age  . TUBAL LIGATION  2010     OB History    Gravida  4   Para  2   Term  1   Preterm  1   AB  2   Living  2     SAB  1   TAB  1   Ectopic      Multiple      Live Births               Home Medications    Prior to Admission medications   Medication Sig Start Date End Date Taking? Authorizing Provider  azithromycin (ZITHROMAX) 250 MG tablet Take 1 tablet (250 mg total) by mouth daily. Take first 2 tablets together, then 1 every day until finished. 04/09/19   [name], MD  buPROPion  (WELLBUTRIN XL) 150 MG 24 hr tablet Take 150 mg by mouth daily.    [provider]  citalopram (CELEXA) 40 MG tablet Take 40 mg by mouth daily.    [provider]  HYDROcodone-acetaminophen (NORCO/VICODIN) 5-325 MG tablet Take 1 tablet by mouth every 4 (four) hours as needed. 04/08/19   [name], MD  ibuprofen (ADVIL,MOTRIN) 600 MG tablet Take 1 tablet (600 mg total) by mouth every 6 (six) hours as needed for pain. Patient not taking: Reported on 07/10/2014 09/22/12   [name], MD  insulin glargine (LANTUS) 100 UNIT/ML injection Inject 30 Units into the skin at bedtime. Patient taking differently: Inject 30 Units into the skin at bedtime. Per patient information- 30 units at bedtime with 2 units added if blood sugar level is greater than 320. 02/29/12 02/28/13  [name], MD  insulin NPH-insulin regular (NOVOLIN 70/30) (70-30) 100 UNIT/ML injection Inject 3 Units into the skin.    [provider]  metFORMIN (GLUCOPHAGE) 1000 MG tablet Take 1,000 mg by mouth 2 (two)  times daily with a meal.    [provider]  sulfamethoxazole-trimethoprim (SEPTRA DS) 800-160 MG per tablet Take 1 tablet by mouth every 12 (twelve) hours. Patient not taking: Reported on 07/10/2014 09/22/12   Loren Racer, MD  traMADol (ULTRAM) 50 MG tablet Take 1 tablet (50 mg total) by mouth every 6 (six) hours as needed for pain. Patient not taking: Reported on 07/10/2014 09/22/12   Loren Racer, MD    Family History Family History  Problem Relation Age of Onset  . Hypertension Mother   . Lung cancer Mother   . Cancer Mother        stage 4 lung     Social History Social History   Tobacco Use  . Smoking status: Passive Smoke Exposure - Never Smoker  . Smokeless tobacco: Never Used  Substance Use Topics  . Alcohol use: Yes    Comment: occasional   . Drug use: No     Allergies   Patient has no known allergies.   Review of Systems Review of Systems   Respiratory: Positive for cough and shortness of breath.   All other systems reviewed and are negative.    Physical Exam Updated Vital Signs BP 107/68   Pulse 100   Temp 98 F (36.7 C) (Oral)   Resp 16   LMP 03/24/2019 (Exact Date)   SpO2 96%   Physical Exam Vitals signs and nursing note reviewed.  Constitutional:      Appearance: She is well-developed.  HENT:     Head: Normocephalic and atraumatic.     Mouth/Throat:     Mouth: Mucous membranes are moist.  Eyes:     Extraocular Movements: Extraocular movements intact.     Pupils: Pupils are equal, round, and reactive to light.  Neck:     Musculoskeletal: Normal range of motion and neck supple.  Cardiovascular:     Rate and Rhythm: Normal rate and regular rhythm.  Pulmonary:     Effort: Pulmonary effort is normal.     Breath sounds: Normal breath sounds.  Abdominal:     General: Bowel sounds are normal.     Palpations: Abdomen is soft.  Musculoskeletal: Normal range of motion.  Skin:    General: Skin is warm.     Capillary Refill: Capillary refill takes less than 2 seconds.  Neurological:     General: No focal deficit present.     Mental Status: She is alert and oriented to person, place, and time.  Psychiatric:        Mood and Affect: Mood normal.        Behavior: Behavior normal.      ED Treatments / Results  Labs (all labs ordered are listed, but only abnormal results are displayed) Labs Reviewed  COMPREHENSIVE METABOLIC PANEL - Abnormal; Notable for the following components:      Result Value   CO2 19 (*)    Glucose, Bld 370 (*)    Calcium 8.7 (*)    Albumin 3.3 (*)    AST 14 (*)    Total Bilirubin 1.4 (*)    Anion gap 18 (*)    All other components within normal limits  CBC WITH DIFFERENTIAL/PLATELET - Abnormal; Notable for the following components:   Platelets 147 (*)    All other components within normal limits  SARS CORONAVIRUS 2 (TAT 6-24 HRS)    EKG EKG  Interpretation  Date/Time:  Friday April 08 2019 07:50:42 EDT Ventricular Rate:  85 PR Interval:  QRS Duration: 98 QT Interval:  384 QTC Calculation: 457 R Axis:   57 Text Interpretation:  Sinus rhythm Baseline wander in lead(s) I II III aVL aVF No significant change since last tracing Confirmed by Jacalyn LefevreHaviland, Croix Presley [phone]([id number]) on 04/08/2019 8:22:44 AM   Radiology Dg Chest Portable 1 View  Result Date: 04/08/2019 CLINICAL DATA:  Cough and shortness of breath for 1 week. Loss of taste. EXAM: PORTABLE CHEST 1 VIEW COMPARISON:  None. FINDINGS: The heart size and mediastinal contours are within normal limits. Mild airspace disease is seen in the left lower lung and right midlung suspicious for multifocal pneumonia, possibly viral pneumonia. No evidence of pleural effusion. IMPRESSION: Mild multifocal airspace disease in left lower lung and right midlung, suspicious for multifocal pneumonia, possibly viral pneumonia. Electronically Signed   By: Danae OrleansJohn A Stahl M.D.   On: 04/08/2019 08:29    Procedures Procedures (including critical care time)  Medications Ordered in ED Medications  cefTRIAXone (ROCEPHIN) 1 g in sodium chloride 0.9 % 100 mL IVPB (1 g Intravenous New Bag/Given 04/08/19 0905)  sodium chloride 0.9 % bolus 1,000 mL (1,000 mLs Intravenous New Bag/Given 04/08/19 0822)  ketorolac (TORADOL) 30 MG/ML injection 30 mg (30 mg Intravenous Given 04/08/19 0822)  dexamethasone (DECADRON) injection 10 mg (10 mg Intravenous Given 04/08/19 0822)  albuterol (VENTOLIN HFA) 108 (90 Base) MCG/ACT inhaler 4 puff (4 puffs Inhalation Given 04/08/19 0811)  AeroChamber Plus Flo-Vu Medium MISC 1 each (1 each Other Given 04/08/19 0811)  azithromycin (ZITHROMAX) tablet 500 mg (500 mg Oral Given 04/08/19 0902)     Initial Impression / Assessment and Plan / ED Course  I have reviewed the triage vital signs and the nursing notes.  Pertinent labs & imaging results that were available during my care of the patient  were reviewed by me and considered in my medical decision making (see chart for details).   I strongly suspect pt has covid.  Pt is oxygenating 99% and looks ok.  She does have hyperglycemia and knows the decadron will increase her sugar.  She has a sliding scale at home to take for her bs.  She won't be d/c home with prednisone due to the hyperglycemia.  Pt will be d/c home with lortab and zithromax.  She is given an albuterol inhaler and spacer.  She knows to return at any time.  She is told to self-isolate until her covid test comes back.  Pt's covid test came back shortly after d/c.  It is positive.  I called pt to let her know.     Sundra Alandngela J Lesnick was evaluated in Emergency Department on 04/08/2019 for the symptoms described in the history of present illness. She was evaluated in the context of the global COVID-19 pandemic, which necessitated consideration that the patient might be at risk for infection with the SARS-CoV-2 virus that causes COVID-19. Institutional protocols and algorithms that pertain to the evaluation of patients at risk for COVID-19 are in a state of rapid change based on information released by regulatory bodies including the CDC and federal and state organizations. These policies and algorithms were followed during the patient's care in the ED.  Final Clinical Impressions(s) / ED Diagnoses   Final diagnoses:  Suspected Covid-19 Virus Infection  Multifocal pneumonia  Hyperglycemia    ED Discharge Orders         Ordered    azithromycin (ZITHROMAX) 250 MG tablet  Daily     04/08/19 0930    HYDROcodone-acetaminophen (NORCO/VICODIN) 5-325 MG tablet  Every 4 hours PRN     04/08/19 0930           Jacalyn LefevreHaviland, Talulah Schirmer, MD 04/08/19 [id number]    Jacalyn LefevreHaviland, Ciela Mahajan, MD 04/08/19 [id number]

## 2019-04-14 ENCOUNTER — Inpatient Hospital Stay (HOSPITAL_COMMUNITY)
Admission: EM | Admit: 2019-04-14 | Discharge: 2019-04-20 | DRG: 177 | Disposition: A | Discharge status: Home / Self Care | Payer: HRSA Program | Attending: Internal Medicine | Admitting: Internal Medicine

## 2019-04-14 ENCOUNTER — Other Ambulatory Visit: Payer: Self-pay

## 2019-04-14 ENCOUNTER — Emergency Department (HOSPITAL_COMMUNITY): Payer: HRSA Program

## 2019-04-14 DIAGNOSIS — Z794 Long term (current) use of insulin: Secondary | ICD-10-CM

## 2019-04-14 DIAGNOSIS — B373 Candidiasis of vulva and vagina: Secondary | ICD-10-CM | POA: Diagnosis present

## 2019-04-14 DIAGNOSIS — J9601 Acute respiratory failure with hypoxia: Secondary | ICD-10-CM | POA: Diagnosis present

## 2019-04-14 DIAGNOSIS — F329 Major depressive disorder, single episode, unspecified: Secondary | ICD-10-CM | POA: Diagnosis present

## 2019-04-14 DIAGNOSIS — E1065 Type 1 diabetes mellitus with hyperglycemia: Secondary | ICD-10-CM | POA: Diagnosis present

## 2019-04-14 DIAGNOSIS — I951 Orthostatic hypotension: Secondary | ICD-10-CM | POA: Diagnosis present

## 2019-04-14 DIAGNOSIS — E109 Type 1 diabetes mellitus without complications: Secondary | ICD-10-CM | POA: Diagnosis present

## 2019-04-14 DIAGNOSIS — E860 Dehydration: Secondary | ICD-10-CM | POA: Diagnosis present

## 2019-04-14 DIAGNOSIS — F419 Anxiety disorder, unspecified: Secondary | ICD-10-CM | POA: Diagnosis present

## 2019-04-14 DIAGNOSIS — R52 Pain, unspecified: Secondary | ICD-10-CM | POA: Diagnosis not present

## 2019-04-14 DIAGNOSIS — Z79899 Other long term (current) drug therapy: Secondary | ICD-10-CM

## 2019-04-14 DIAGNOSIS — E039 Hypothyroidism, unspecified: Secondary | ICD-10-CM | POA: Diagnosis present

## 2019-04-14 DIAGNOSIS — Z7722 Contact with and (suspected) exposure to environmental tobacco smoke (acute) (chronic): Secondary | ICD-10-CM | POA: Diagnosis present

## 2019-04-14 DIAGNOSIS — E876 Hypokalemia: Secondary | ICD-10-CM | POA: Diagnosis not present

## 2019-04-14 DIAGNOSIS — U071 COVID-19: Principal | ICD-10-CM | POA: Diagnosis present

## 2019-04-14 DIAGNOSIS — R55 Syncope and collapse: Secondary | ICD-10-CM

## 2019-04-14 LAB — CBC WITH DIFFERENTIAL/PLATELET
Abs Immature Granulocytes: 0.09 10*3/uL — ABNORMAL HIGH (ref 0.00–0.07)
Basophils Absolute: 0 10*3/uL (ref 0.0–0.1)
Basophils Relative: 1 %
Eosinophils Absolute: 0.2 10*3/uL (ref 0.0–0.5)
Eosinophils Relative: 2 %
HCT: 42 % (ref 36.0–46.0)
Hemoglobin: 14.6 g/dL (ref 12.0–15.0)
Immature Granulocytes: 1 %
Lymphocytes Relative: 18 %
Lymphs Abs: 1.4 10*3/uL (ref 0.7–4.0)
MCH: 29.8 pg (ref 26.0–34.0)
MCHC: 34.8 g/dL (ref 30.0–36.0)
MCV: 85.7 fL (ref 80.0–100.0)
Monocytes Absolute: 0.7 10*3/uL (ref 0.1–1.0)
Monocytes Relative: 9 %
Neutro Abs: 5.1 10*3/uL (ref 1.7–7.7)
Neutrophils Relative %: 69 %
Platelets: 334 10*3/uL (ref 150–400)
RBC: 4.9 MIL/uL (ref 3.87–5.11)
RDW: 12.5 % (ref 11.5–15.5)
WBC: 7.5 10*3/uL (ref 4.0–10.5)
nRBC: 0 % (ref 0.0–0.2)

## 2019-04-14 LAB — BLOOD GAS, VENOUS
Acid-base deficit: 14 mmol/L — ABNORMAL HIGH (ref 0.0–2.0)
Bicarbonate: 11.4 mmol/L — ABNORMAL LOW (ref 20.0–28.0)
FIO2: 21
O2 Saturation: 61.7 %
Patient temperature: 99.1
pCO2, Ven: 26.2 mmHg — ABNORMAL LOW (ref 44.0–60.0)
pH, Ven: 7.263 (ref 7.250–7.430)
pO2, Ven: 33.3 mmHg (ref 32.0–45.0)

## 2019-04-14 LAB — URINALYSIS, ROUTINE W REFLEX MICROSCOPIC
Bacteria, UA: NONE SEEN
Bilirubin Urine: NEGATIVE
Glucose, UA: >=500 mg/dL — AB
Ketones, ur: 80 mg/dL — AB
Nitrite: NEGATIVE
Protein, ur: 30 mg/dL — AB
Specific Gravity, Urine: 1.033 — ABNORMAL HIGH (ref 1.005–1.030)
pH: 6 (ref 5.0–8.0)

## 2019-04-14 LAB — COMPREHENSIVE METABOLIC PANEL
ALT: 12 U/L (ref 0–44)
AST: 11 U/L — ABNORMAL LOW (ref 15–41)
Albumin: 3.7 g/dL (ref 3.5–5.0)
Alkaline Phosphatase: 87 U/L (ref 38–126)
Anion gap: 17 — ABNORMAL HIGH (ref 5–15)
BUN: 17 mg/dL (ref 6–20)
CO2: 12 mmol/L — ABNORMAL LOW (ref 22–32)
Calcium: 9 mg/dL (ref 8.9–10.3)
Chloride: 106 mmol/L (ref 98–111)
Creatinine, Ser: 0.78 mg/dL (ref 0.44–1.00)
GFR calc Af Amer: >60 mL/min (ref >60)
GFR calc non Af Amer: >60 mL/min (ref >60)
Glucose, Bld: 384 mg/dL — ABNORMAL HIGH (ref 70–99)
Potassium: 3.9 mmol/L (ref 3.5–5.1)
Sodium: 135 mmol/L (ref 135–145)
Total Bilirubin: 1.7 mg/dL — ABNORMAL HIGH (ref 0.3–1.2)
Total Protein: 7.5 g/dL (ref 6.5–8.1)

## 2019-04-14 MED ORDER — SODIUM CHLORIDE 0.9 % IV BOLUS
1000.0000 mL | Freq: Once | INTRAVENOUS | Status: AC
  Administered 2019-04-14: 1000 mL via INTRAVENOUS

## 2019-04-14 MED ORDER — ONDANSETRON HCL 4 MG/2ML IJ SOLN
4.0000 mg | Freq: Once | INTRAMUSCULAR | Status: AC
  Administered 2019-04-14: 4 mg via INTRAVENOUS
  Filled 2019-04-14: qty 2

## 2019-04-14 MED ORDER — INSULIN GLARGINE 100 UNIT/ML ~~LOC~~ SOLN
36.0000 [IU] | Freq: Every day | SUBCUTANEOUS | Status: DC
  Administered 2019-04-14: 36 [IU] via SUBCUTANEOUS
  Filled 2019-04-14: qty 0.36

## 2019-04-14 NOTE — ED Notes (Addendum)
Made aware that urine sample is needed. Patient states that she is feeling nauseous and experiencing heartburn. Patient also endorses dizziness that is only relieved by sleeping. EDP made aware.

## 2019-04-14 NOTE — ED Triage Notes (Signed)
Pt arrived via [facility] from home with CC near syncople  episode. Pt denies injury or pain but endorses generalized weakness. Hx of Type 1 Diabetes. Pt last ate 0900 oatmeal and has not taken any insulin today. Per EMS Afebrile CBG 441 VSS. Pt is COVID POS tested 9/18 Med [facility]

## 2019-04-14 NOTE — ED Provider Notes (Signed)
[name] Provider Note   CSN: [id number] Arrival date & time: 04/14/19  1847     History   Chief Complaint Chief Complaint  Patient presents with   Weakness    COVID POSITIVE    HPI [name] is a 48 y.o. female.     HPI Patient presents 6 days after being diagnosed with coronavirus, now with concern for weakness and an episode of syncope. Patient has history of insulin-dependent diabetes, hypothyroidism, and depression.  On she notes that she was generally well until about 1 week ago when she developed cough, weakness. She was diagnosed at our affiliated facility 6 days ago, and since that time has been taking her medication as directed, including insulin until today. Today, she took her morning dose, but did not take subsequent doses of insulin. She notes that in general her cough is improved, but overall weakness has increased, with easy fatigability, normal capacity to perform previously normal activities of daily living. Today, after walking to her mailbox, she sat down, felt lightheaded and had an episode of syncope. No trauma. Upon awakening, and prior to the episode she had no chest pain. Currently, at rest, she denies complaints, beyond generalized weakness. She notes that she did take her blood glucose level at home, just prior to ED transport found to be elevated. Past Medical History:  Diagnosis Date   Depression    Diabetes mellitus    Hypothyroidism    Perirectal abscess     Patient Active Problem List   Diagnosis Date Noted   Yeast infection 02/27/2012   Cellulitis of buttock 02/25/2012   DM (diabetes mellitus), type 2, uncontrolled (HCC) 02/25/2012    Past Surgical History:  Procedure Laterality Date   BREAST LUMPECTOMY     at age 48   CESAREAN SECTION  12/21/2008   INCISION AND DRAINAGE PERIRECTAL ABSCESS  02/26/2012   Procedure: IRRIGATION AND DEBRIDEMENT PERIRECTAL ABSCESS;  Surgeon: Kandis Cockingavid H  Newman, MD;  Location: [facility] OR;  Service: General;  Laterality: Left;   PATELLA RELEASE AND MANIPULATION     THYROID SURGERY     TONSILLECTOMY     at 48 years of age   TUBAL LIGATION  2010     OB History    Gravida  4   Para  2   Term  1   Preterm  1   AB  2   Living  2     SAB  1   TAB  1   Ectopic      Multiple      Live Births               Home Medications    Prior to Admission medications   Medication Sig Start Date End Date Taking? Authorizing Provider  gabapentin (NEURONTIN) 100 MG capsule Take 100 mg by mouth 3 (three) times daily.   Yes [provider]  insulin aspart protamine- aspart (NOVOLOG MIX 70/30) (70-30) 100 UNIT/ML injection Inject 1 Units into the skin See admin instructions. 1 Unit per every 30/130   Yes [provider]  insulin degludec (TRESIBA FLEXTOUCH) 100 UNIT/ML SOPN FlexTouch Pen Inject 36 Units into the skin at bedtime.   Yes [provider]  sertraline (ZOLOFT) 100 MG tablet Take 100 mg by mouth daily.   Yes [provider]  azithromycin (ZITHROMAX) 250 MG tablet Take 1 tablet (250 mg total) by mouth daily. Take first 2 tablets together, then 1 every day  until finished. Patient not taking: Reported on 04/14/2019 04/09/19   Natalie Pence, MD  buPROPion (WELLBUTRIN XL) 150 MG 24 hr tablet Take 150 mg by mouth daily.    [provider]  citalopram (CELEXA) 40 MG tablet Take 40 mg by mouth daily.    [provider]  HYDROcodone-acetaminophen (NORCO/VICODIN) 5-325 MG tablet Take 1 tablet by mouth every 4 (four) hours as needed. Patient not taking: Reported on 04/14/2019 04/08/19   Natalie Pence, MD  ibuprofen (ADVIL,MOTRIN) 600 MG tablet Take 1 tablet (600 mg total) by mouth every 6 (six) hours as needed for pain. Patient not taking: Reported on 07/10/2014 09/22/12   [name], MD  insulin glargine (LANTUS) 100 UNIT/ML injection Inject 30 Units into the skin at  bedtime. Patient not taking: Reported on 04/14/2019 02/29/12 02/28/13  [name], MD  insulin NPH-insulin regular (NOVOLIN 70/30) (70-30) 100 UNIT/ML injection Inject 3 Units into the skin.    [provider]  metFORMIN (GLUCOPHAGE) 1000 MG tablet Take 1,000 mg by mouth 2 (two) times daily with a meal.    [provider]  sulfamethoxazole-trimethoprim (SEPTRA DS) 800-160 MG per tablet Take 1 tablet by mouth every 12 (twelve) hours. Patient not taking: Reported on 07/10/2014 09/22/12   [name], MD  traMADol (ULTRAM) 50 MG tablet Take 1 tablet (50 mg total) by mouth every 6 (six) hours as needed for pain. Patient not taking: Reported on 07/10/2014 09/22/12   [name], MD    Family History Family History  Problem Relation Age of Onset   Hypertension Mother    Lung cancer Mother    Cancer Mother        stage 4 lung     Social History Social History   Tobacco Use   Smoking status: Passive Smoke Exposure - Never Smoker   Smokeless tobacco: Never Used  Substance Use Topics   Alcohol use: Yes    Comment: occasional    Drug use: No     Allergies   Patient has no known allergies.   Review of Systems Review of Systems  Constitutional:       Per HPI, otherwise negative  HENT:       Per HPI, otherwise negative  Respiratory:       Per HPI, otherwise negative  Cardiovascular:       Per HPI, otherwise negative  Gastrointestinal: Negative for vomiting.  Endocrine:       Negative aside from HPI  Genitourinary:       Neg aside from HPI   Musculoskeletal:       Per HPI, otherwise negative  Skin: Negative.   Neurological: Positive for syncope and weakness.     Physical Exam Updated Vital Signs BP 116/78 (BP Location: Right Arm)    Pulse 89    Temp 99.1 F (37.3 C)    Resp 20    Ht 5\' 6"  (1.676 m)    Wt 63 kg    LMP 03/24/2019 (Exact Date)    SpO2 98%    BMI 22.44 kg/m   Physical Exam Vitals signs and nursing note reviewed.   Constitutional:      General: She is not in acute distress.    Appearance: She is well-developed.  HENT:     Head: Normocephalic and atraumatic.  Eyes:     Conjunctiva/sclera: Conjunctivae normal.  Cardiovascular:     Rate and Rhythm: Normal rate and regular rhythm.  Pulmonary:     Effort: Pulmonary effort  is normal. No respiratory distress.     Breath sounds: Normal breath sounds. No stridor.  Abdominal:     General: There is no distension.  Skin:    General: Skin is warm and dry.  Neurological:     Mental Status: She is alert and oriented to person, place, and time.     Cranial Nerves: No cranial nerve deficit.      ED Treatments / Results  Labs (all labs ordered are listed, but only abnormal results are displayed) Labs Reviewed  COMPREHENSIVE METABOLIC PANEL - Abnormal; Notable for the following components:      Result Value   CO2 12 (*)    Glucose, Bld 384 (*)    AST 11 (*)    Total Bilirubin 1.7 (*)    Anion gap 17 (*)    All other components within normal limits  CBC WITH DIFFERENTIAL/PLATELET - Abnormal; Notable for the following components:   Abs Immature Granulocytes 0.09 (*)    All other components within normal limits  BLOOD GAS, VENOUS - Abnormal; Notable for the following components:   pCO2, Ven 26.2 (*)    Bicarbonate 11.4 (*)    Acid-base deficit 14.0 (*)    All other components within normal limits  URINALYSIS, ROUTINE W REFLEX MICROSCOPIC    EKG None  Radiology Dg Chest Port 1 View  Result Date: 04/14/2019 CLINICAL DATA:  Pt arrived via [facility] from home with CC near syncople episode. Pt denies injury or pain but endorses generalized weakness. Hx of Type 1 Diabetes. Pt last ate 0900 oatmeal and has not taken any insulin today. Per EMS Afebrile CBG 441 VSS. Pt is COVID POS tested 9/18 [facility] EXAM: PORTABLE CHEST 1 VIEW COMPARISON:  04/08/2019 FINDINGS: Subtle areas of hazy airspace opacity noted on the prior radiographs have improved.  There are mostly resolved. No new lung abnormalities.  No pleural effusion or pneumothorax. Heart, mediastinum hila are within normal limits. Skeletal structures are unremarkable. IMPRESSION: 1. Improved areas of multifocal lung opacity when compared to the prior chest radiograph. 2. No new abnormalities. Electronically Signed   By: Amie Portland M.D.   On: 04/14/2019 19:54    Procedures Procedures (including critical care time)  Medications Ordered in ED Medications  sodium chloride 0.9 % bolus 1,000 mL (0 mLs Intravenous Stopped 04/14/19 2115)  ondansetron (ZOFRAN) injection 4 mg (4 mg Intravenous Given 04/14/19 2115)     Initial Impression / Assessment and Plan / ED Course  I have reviewed the triage vital signs and the nursing notes.  Pertinent labs & imaging results that were available during my care of the patient were reviewed by me and considered in my medical decision making (see chart for details).        9:55 PM Patient continues to have increased work of breathing, tachypnea, tachycardia. X-ray continues to show bilateral infiltrates, though possibly less than 1 week ago. With persistent/worsening symptoms, questionable near syncope, and ongoing hyperglycemia, though there is no initial evidence for DKA, the patient will require admission for further monitoring, management of her coronavirus infection.  Final Clinical Impressions(s) / ED Diagnoses   Final diagnoses:  COVID-19 virus infection  Syncope and collapse     Gerhard Munch, MD 04/14/19 2156

## 2019-04-15 ENCOUNTER — Inpatient Hospital Stay (HOSPITAL_COMMUNITY): Payer: Self-pay

## 2019-04-15 ENCOUNTER — Inpatient Hospital Stay (HOSPITAL_COMMUNITY): Payer: HRSA Program

## 2019-04-15 ENCOUNTER — Encounter (HOSPITAL_COMMUNITY): Payer: Self-pay | Admitting: Internal Medicine

## 2019-04-15 DIAGNOSIS — R55 Syncope and collapse: Secondary | ICD-10-CM

## 2019-04-15 DIAGNOSIS — U071 COVID-19: Secondary | ICD-10-CM

## 2019-04-15 DIAGNOSIS — E109 Type 1 diabetes mellitus without complications: Secondary | ICD-10-CM | POA: Diagnosis present

## 2019-04-15 DIAGNOSIS — R52 Pain, unspecified: Secondary | ICD-10-CM

## 2019-04-15 LAB — COMPREHENSIVE METABOLIC PANEL
ALT: 13 U/L (ref 0–44)
AST: 13 U/L — ABNORMAL LOW (ref 15–41)
Albumin: 3.3 g/dL — ABNORMAL LOW (ref 3.5–5.0)
Alkaline Phosphatase: 76 U/L (ref 38–126)
Anion gap: 16 — ABNORMAL HIGH (ref 5–15)
BUN: 15 mg/dL (ref 6–20)
CO2: 11 mmol/L — ABNORMAL LOW (ref 22–32)
Calcium: 8.2 mg/dL — ABNORMAL LOW (ref 8.9–10.3)
Chloride: 109 mmol/L (ref 98–111)
Creatinine, Ser: 0.68 mg/dL (ref 0.44–1.00)
GFR calc Af Amer: >60 mL/min (ref >60)
GFR calc non Af Amer: >60 mL/min (ref >60)
Glucose, Bld: 244 mg/dL — ABNORMAL HIGH (ref 70–99)
Potassium: 3.6 mmol/L (ref 3.5–5.1)
Sodium: 136 mmol/L (ref 135–145)
Total Bilirubin: 1.4 mg/dL — ABNORMAL HIGH (ref 0.3–1.2)
Total Protein: 6.9 g/dL (ref 6.5–8.1)

## 2019-04-15 LAB — MRSA PCR SCREENING: MRSA by PCR: NEGATIVE

## 2019-04-15 LAB — CBG MONITORING, ED: Glucose-Capillary: 316 mg/dL — ABNORMAL HIGH (ref 70–99)

## 2019-04-15 LAB — CBC
HCT: 38 % (ref 36.0–46.0)
Hemoglobin: 13.6 g/dL (ref 12.0–15.0)
MCH: 30.4 pg (ref 26.0–34.0)
MCHC: 35.8 g/dL (ref 30.0–36.0)
MCV: 85 fL (ref 80.0–100.0)
Platelets: 306 10*3/uL (ref 150–400)
RBC: 4.47 MIL/uL (ref 3.87–5.11)
RDW: 12.5 % (ref 11.5–15.5)
WBC: 8.3 10*3/uL (ref 4.0–10.5)
nRBC: 0 % (ref 0.0–0.2)

## 2019-04-15 LAB — GLUCOSE, CAPILLARY
Glucose-Capillary: 193 mg/dL — ABNORMAL HIGH (ref 70–99)
Glucose-Capillary: 257 mg/dL — ABNORMAL HIGH (ref 70–99)
Glucose-Capillary: 233 mg/dL — ABNORMAL HIGH (ref 70–99)
Glucose-Capillary: 298 mg/dL — ABNORMAL HIGH (ref 70–99)
Glucose-Capillary: 274 mg/dL — ABNORMAL HIGH (ref 70–99)

## 2019-04-15 LAB — ECHOCARDIOGRAM LIMITED
Height: 66 in
Weight: 2388.02 oz

## 2019-04-15 LAB — C-REACTIVE PROTEIN
CRP: 2.3 mg/dL — ABNORMAL HIGH (ref <1.0)
CRP: 1.2 mg/dL — ABNORMAL HIGH (ref <1.0)

## 2019-04-15 LAB — FERRITIN
Ferritin: 150 ng/mL (ref 11–307)
Ferritin: 135 ng/mL (ref 11–307)

## 2019-04-15 LAB — HEMOGLOBIN A1C
Hgb A1c MFr Bld: 14.3 % — ABNORMAL HIGH (ref 4.8–5.6)
Mean Plasma Glucose: 363.71 mg/dL

## 2019-04-15 LAB — SAMPLE TO BLOOD BANK

## 2019-04-15 LAB — D-DIMER, QUANTITATIVE: D-Dimer, Quant: 1.49 ug/mL-FEU — ABNORMAL HIGH (ref 0.00–0.50)

## 2019-04-15 MED ORDER — DEXAMETHASONE 6 MG PO TABS
6.0000 mg | ORAL_TABLET | Freq: Every day | ORAL | Status: DC
  Administered 2019-04-15 – 2019-04-16 (×3): 6 mg via ORAL
  Filled 2019-04-15 (×3): qty 1

## 2019-04-15 MED ORDER — SERTRALINE HCL 50 MG PO TABS
100.0000 mg | ORAL_TABLET | Freq: Every day | ORAL | Status: DC
  Administered 2019-04-15 – 2019-04-20 (×6): 100 mg via ORAL
  Filled 2019-04-15 (×7): qty 2

## 2019-04-15 MED ORDER — INSULIN ASPART 100 UNIT/ML ~~LOC~~ SOLN
0.0000 [IU] | Freq: Three times a day (TID) | SUBCUTANEOUS | Status: DC
  Administered 2019-04-15: 11 [IU] via SUBCUTANEOUS
  Administered 2019-04-15: 7 [IU] via SUBCUTANEOUS
  Administered 2019-04-16: 15 [IU] via SUBCUTANEOUS
  Administered 2019-04-16 (×2): 11 [IU] via SUBCUTANEOUS
  Administered 2019-04-17 – 2019-04-18 (×5): 7 [IU] via SUBCUTANEOUS
  Administered 2019-04-19: 4 [IU] via SUBCUTANEOUS
  Administered 2019-04-19: 3 [IU] via SUBCUTANEOUS
  Administered 2019-04-20: 4 [IU] via SUBCUTANEOUS

## 2019-04-15 MED ORDER — ACETAMINOPHEN 325 MG PO TABS
650.0000 mg | ORAL_TABLET | Freq: Four times a day (QID) | ORAL | Status: DC | PRN
  Administered 2019-04-15 – 2019-04-18 (×5): 650 mg via ORAL
  Filled 2019-04-15 (×5): qty 2

## 2019-04-15 MED ORDER — INSULIN GLARGINE 100 UNIT/ML ~~LOC~~ SOLN
42.0000 [IU] | Freq: Every day | SUBCUTANEOUS | Status: DC
  Filled 2019-04-15: qty 0.42

## 2019-04-15 MED ORDER — SODIUM CHLORIDE 0.9 % IV SOLN
100.0000 mg | Freq: Every day | INTRAVENOUS | Status: AC
  Administered 2019-04-16 – 2019-04-19 (×4): 100 mg via INTRAVENOUS
  Filled 2019-04-15 (×4): qty 20

## 2019-04-15 MED ORDER — INSULIN ASPART 100 UNIT/ML ~~LOC~~ SOLN
0.0000 [IU] | Freq: Every day | SUBCUTANEOUS | Status: DC
  Administered 2019-04-15 – 2019-04-16 (×2): 3 [IU] via SUBCUTANEOUS
  Administered 2019-04-17: 2 [IU] via SUBCUTANEOUS
  Administered 2019-04-18: 5 [IU] via SUBCUTANEOUS
  Administered 2019-04-19: 4 [IU] via SUBCUTANEOUS

## 2019-04-15 MED ORDER — ONDANSETRON HCL 4 MG/2ML IJ SOLN
4.0000 mg | Freq: Four times a day (QID) | INTRAMUSCULAR | Status: DC | PRN
  Administered 2019-04-15 – 2019-04-16 (×3): 4 mg via INTRAVENOUS
  Filled 2019-04-15 (×3): qty 2

## 2019-04-15 MED ORDER — GABAPENTIN 100 MG PO CAPS
100.0000 mg | ORAL_CAPSULE | Freq: Three times a day (TID) | ORAL | Status: DC
  Administered 2019-04-15 – 2019-04-20 (×16): 100 mg via ORAL
  Filled 2019-04-15 (×16): qty 1

## 2019-04-15 MED ORDER — ACETAMINOPHEN 650 MG RE SUPP: 650.0000 mg | Freq: Four times a day (QID) | RECTAL | Status: DC | PRN

## 2019-04-15 MED ORDER — INSULIN ASPART 100 UNIT/ML ~~LOC~~ SOLN
6.0000 [IU] | Freq: Three times a day (TID) | SUBCUTANEOUS | Status: DC
  Administered 2019-04-15 – 2019-04-17 (×7): 6 [IU] via SUBCUTANEOUS

## 2019-04-15 MED ORDER — ENSURE ENLIVE PO LIQD
237.0000 mL | Freq: Two times a day (BID) | ORAL | Status: DC
  Administered 2019-04-15 – 2019-04-20 (×9): 237 mL via ORAL

## 2019-04-15 MED ORDER — SODIUM CHLORIDE 0.9 % IV SOLN
200.0000 mg | Freq: Once | INTRAVENOUS | Status: AC
  Administered 2019-04-15: 200 mg via INTRAVENOUS
  Filled 2019-04-15 (×2): qty 40

## 2019-04-15 MED ORDER — INSULIN ASPART 100 UNIT/ML ~~LOC~~ SOLN
0.0000 [IU] | SUBCUTANEOUS | Status: DC
  Administered 2019-04-15: 7 [IU] via SUBCUTANEOUS
  Administered 2019-04-15: 5 [IU] via SUBCUTANEOUS
  Filled 2019-04-15: qty 0.09

## 2019-04-15 MED ORDER — CYCLOBENZAPRINE HCL 5 MG PO TABS
5.0000 mg | ORAL_TABLET | Freq: Three times a day (TID) | ORAL | Status: DC | PRN
  Administered 2019-04-15 – 2019-04-19 (×5): 5 mg via ORAL
  Filled 2019-04-15 (×6): qty 1

## 2019-04-15 MED ORDER — SODIUM CHLORIDE 0.9 % IV SOLN: 200.0000 mg | Freq: Once | INTRAVENOUS | Status: DC

## 2019-04-15 MED ORDER — ENOXAPARIN SODIUM 40 MG/0.4ML ~~LOC~~ SOLN
40.0000 mg | Freq: Every day | SUBCUTANEOUS | Status: DC
  Administered 2019-04-15 – 2019-04-19 (×6): 40 mg via SUBCUTANEOUS
  Filled 2019-04-15 (×6): qty 0.4

## 2019-04-15 MED ORDER — SODIUM CHLORIDE 0.9 % IV SOLN: 100.0000 mg | INTRAVENOUS | Status: DC

## 2019-04-15 MED ORDER — ALUM & MAG HYDROXIDE-SIMETH 200-200-20 MG/5ML PO SUSP
30.0000 mL | Freq: Once | ORAL | Status: AC
  Administered 2019-04-15: 30 mL via ORAL
  Filled 2019-04-15: qty 30

## 2019-04-15 MED ORDER — ONDANSETRON HCL 4 MG PO TABS: 4.0000 mg | ORAL_TABLET | Freq: Four times a day (QID) | ORAL | Status: DC | PRN

## 2019-04-15 MED ORDER — SODIUM CHLORIDE 0.9 % IV SOLN
200.0000 mg | Freq: Once | INTRAVENOUS | Status: DC
  Filled 2019-04-15: qty 40

## 2019-04-15 MED ORDER — GUAIFENESIN-DM 100-10 MG/5ML PO SYRP: 5.0000 mL | ORAL_SOLUTION | ORAL | Status: DC | PRN

## 2019-04-15 MED ORDER — INSULIN DETEMIR 100 UNIT/ML ~~LOC~~ SOLN
42.0000 [IU] | Freq: Every day | SUBCUTANEOUS | Status: DC
  Administered 2019-04-15: 42 [IU] via SUBCUTANEOUS
  Filled 2019-04-15: qty 0.42

## 2019-04-15 MED ORDER — SODIUM CHLORIDE 0.9 % IV SOLN
INTRAVENOUS | Status: AC
  Administered 2019-04-15: 16:00:00 via INTRAVENOUS

## 2019-04-15 NOTE — Plan of Care (Signed)
Patients VS will remain stable and pt will remain free from falls and injury throughout shift.

## 2019-04-15 NOTE — Progress Notes (Signed)
Venous duplex lower ext  has been completed. Refer to [facility] under chart review to view preliminary results.   04/15/2019  4:05 PM [name], [name]

## 2019-04-15 NOTE — Progress Notes (Signed)
Initial Nutrition Assessment  DOCUMENTATION CODES:   Not applicable  INTERVENTION:    Ensure Enlive po TID, each supplement provides 350 kcal and 20 grams of protein.   Pt receiving Hormel Shake daily with Breakfast which provides 520 kcals and 22 g of protein and Magic cup BID with lunch and dinner, each supplement provides 290 kcal and 9 grams of protein, automatically on meal trays to optimize nutritional intake.    Encourage PO's.  NUTRITION DIAGNOSIS:   Increased nutrient needs related to acute illness as evidenced by estimated needs.  GOAL:   Patient will meet greater than or equal to 90% of their needs  MONITOR:   PO intake, Supplement acceptance, Labs  REASON FOR ASSESSMENT:   Malnutrition Screening Tool    ASSESSMENT:   48 yo female admitted with LOC, COVID-19 (dx September 18). PMH includes DM-1, depression, perirectal abscess, hypothyroidism.   Labs reviewed. A1C 14.3 (H) CBG's: 257-233  Medications reviewed and include decadron, novolog, levemir.   Unsure of usual weight. Patient reported poor intake and weight loss PTA. Diarrhea and nausea are resolving.   NUTRITION - FOCUSED PHYSICAL EXAM:  deferred  Diet Order:   Diet Order            Diet Carb Modified Fluid consistency: Thin; Room service appropriate? Yes  Diet effective now              EDUCATION NEEDS:   Not appropriate for education at this time  Skin:  Skin Assessment: Reviewed RN Assessment  Last BM:  9/24  Height:   Ht Readings from Last 1 Encounters:  04/15/19 5\' 6"  (1.676 m)    Weight:   Wt Readings from Last 1 Encounters:  04/15/19 67.7 kg    Ideal Body Weight:  59.1 kg  BMI:  Body mass index is 24.09 kg/m.  Estimated Nutritional Needs:   Kcal:  1800-2000  Protein:  85-110 gm  Fluid:  >/= 1.8 L    Molli Barrows, RD, LDN, [place] Pager [phone] After Hours Pager [phone]

## 2019-04-15 NOTE — ED Notes (Signed)
Pt. Documented in error see above note in chart. 

## 2019-04-15 NOTE — Progress Notes (Signed)
During orthostatic b/p Pt had heaviness in her chest and c/o head hurting at the end when she sat down.

## 2019-04-15 NOTE — Progress Notes (Signed)
  Echocardiogram 2D Echocardiogram has been performed.  [name] 04/15/2019, 12:13 PM

## 2019-04-15 NOTE — H&P (Signed)
History and Physical    [name] OHY:[id number] DOB: 14-Jan-1971 DOA: 04/14/2019  PCP: Patient, No Pcp Per  Patient coming from: Home.  Chief Complaint: Shortness of breath and loss of consciousness.  HPI: [name] is a 48 y.o. female with history of diabetes mellitus type 1 was brought to the ER after patient had a brief episode of loss of consciousness.  Patient was diagnosed with COVID-19 on April 08, 2019 when patient presented to the ER with shortness of breath.  Since then patient has been having persistent shortness of breath which has gradually progressed.  Denies any chest pain nausea vomiting or diarrhea but did have couple episodes of diarrhea a week ago.  Patient has been getting increasingly short of breath with minimal exertion and dizzy.  Last evening patient went to collect the mail and on the way back patient felt dizzy and sat on the floor and laid down.  Following which patient states he lost consciousness for some time but does not recall the exact time.  She called EMS and the patient was brought to the ER.  Denies any incontinence of urine or bowel.  ED Course: In the ER chest x-ray showed bilateral infiltrates but improved when compared to previous.  EKG shows normal sinus rhythm.  QTC was 438 ms.  There were 99.1.  Patient states her blood pressure was running high when EMS came.  Presently her blood pressure sometimes fluctuates to the higher 170s but most of the time is in the 90s.  Urine shows ketones.  Patient's blood sugar is 384 with anion gap of 17.  Patient's home dose of long-acting insulin 36 units has been ordered.  Repeat metabolic panel is at been ordered after receiving 1 L fluid bolus.  Patient admitted for acute respiratory failure hypoxia secondary to COVID pneumonia.  Review of Systems: As per HPI, rest all negative.   Past Medical History:  Diagnosis Date   Depression    Diabetes mellitus    Hypothyroidism    Perirectal abscess       Past Surgical History:  Procedure Laterality Date   BREAST LUMPECTOMY     at age 59   CESAREAN SECTION  12/21/2008   INCISION AND DRAINAGE PERIRECTAL ABSCESS  02/26/2012   Procedure: IRRIGATION AND DEBRIDEMENT PERIRECTAL ABSCESS;  Surgeon: Shann Medal, MD;  Location: [facility];  Service: General;  Laterality: Left;   PATELLA RELEASE AND MANIPULATION     THYROID SURGERY     TONSILLECTOMY     at 48 years of age   TUBAL LIGATION  2010     reports that she is a non-smoker but has been exposed to tobacco smoke. She has never used smokeless tobacco. She reports current alcohol use. She reports that she does not use drugs.  No Known Allergies  Family History  Problem Relation Age of Onset   Hypertension Mother    Lung cancer Mother    Cancer Mother        stage 4 lung     Prior to Admission medications   Medication Sig Start Date End Date Taking? Authorizing Provider  gabapentin (NEURONTIN) 100 MG capsule Take 100 mg by mouth 3 (three) times daily.   Yes [provider]  insulin aspart protamine- aspart (NOVOLOG MIX 70/30) (70-30) 100 UNIT/ML injection Inject 1 Units into the skin See admin instructions. 1 Unit per every 30/130   Yes [provider]  insulin degludec (TRESIBA FLEXTOUCH) 100 UNIT/ML SOPN  FlexTouch Pen Inject 36 Units into the skin at bedtime.   Yes [provider]  sertraline (ZOLOFT) 100 MG tablet Take 100 mg by mouth daily.   Yes [provider]  azithromycin (ZITHROMAX) 250 MG tablet Take 1 tablet (250 mg total) by mouth daily. Take first 2 tablets together, then 1 every day until finished. Patient not taking: Reported on 04/14/2019 04/09/19   Jacalyn Lefevre, MD  buPROPion (WELLBUTRIN XL) 150 MG 24 hr tablet Take 150 mg by mouth daily.    [provider]  citalopram (CELEXA) 40 MG tablet Take 40 mg by mouth daily.    [provider]  HYDROcodone-acetaminophen (NORCO/VICODIN) 5-325 MG tablet Take 1 tablet  by mouth every 4 (four) hours as needed. Patient not taking: Reported on 04/14/2019 04/08/19   Jacalyn Lefevre, MD  ibuprofen (ADVIL,MOTRIN) 600 MG tablet Take 1 tablet (600 mg total) by mouth every 6 (six) hours as needed for pain. Patient not taking: Reported on 07/10/2014 09/22/12   Loren Racer, MD  insulin glargine (LANTUS) 100 UNIT/ML injection Inject 30 Units into the skin at bedtime. Patient not taking: Reported on 04/14/2019 02/29/12 02/28/13  Hollice Espy, MD  insulin NPH-insulin regular (NOVOLIN 70/30) (70-30) 100 UNIT/ML injection Inject 3 Units into the skin.    [provider]  metFORMIN (GLUCOPHAGE) 1000 MG tablet Take 1,000 mg by mouth 2 (two) times daily with a meal.    [provider]  sulfamethoxazole-trimethoprim (SEPTRA DS) 800-160 MG per tablet Take 1 tablet by mouth every 12 (twelve) hours. Patient not taking: Reported on 07/10/2014 09/22/12   Loren Racer, MD  traMADol (ULTRAM) 50 MG tablet Take 1 tablet (50 mg total) by mouth every 6 (six) hours as needed for pain. Patient not taking: Reported on 07/10/2014 09/22/12   Loren Racer, MD    Physical Exam: Constitutional: Moderately built and nourished. Vitals:   04/14/19 2200 04/14/19 2230 04/14/19 2321 04/14/19 2330  BP: 101/68 101/64 109/64 116/75  Pulse: 91 93 92 94  Resp: Temp:      SpO2: 98% 97% 98% 98%  Weight:      Height:       Eyes: Anicteric no pallor. ENMT: No discharge from the ears eyes nose and mouth. Neck: No mass felt.  No neck rigidity. Respiratory: No rhonchi or crepitations. Cardiovascular: S1-S2 heard. Abdomen: Soft nontender bowel sounds present. Musculoskeletal: No edema. Skin: No rash. Neurologic: Alert awake oriented to time place and person.  Moves all extremities.  No facial asymmetry tongue is midline.  Pupils are equal and reacting to light. Psychiatric: Appears normal.   Labs on Admission: I have personally reviewed following labs and  imaging studies  CBC: Recent Labs  Lab 04/08/19 0808 04/14/19 1951  WBC 6.0 7.5  NEUTROABS 4.4 5.1  HGB 13.8 14.6  HCT 39.9 42.0  MCV 86.6 85.7  PLT 147* 334   Basic Metabolic Panel: Recent Labs  Lab 04/08/19 0808 04/14/19 1951  NA 136 135  K 4.1 3.9  CL 99 106  CO2 19* 12*  GLUCOSE 370* 384*  BUN 8 17  CREATININE 0.58 0.78  CALCIUM 8.7* 9.0   GFR: Estimated Creatinine Clearance: 81.4 mL/min (by C-G formula based on SCr of 0.78 mg/dL). Liver Function Tests: Recent Labs  Lab 04/08/19 0808 04/14/19 1951  AST 14* 11*  ALT 15 12  ALKPHOS 83 87  BILITOT 1.4* 1.7*  PROT 7.0 7.5  ALBUMIN 3.3* 3.7   No results  for input(s): LIPASE, AMYLASE in the last 168 hours. No results for input(s): AMMONIA in the last 168 hours. Coagulation Profile: No results for input(s): INR, PROTIME in the last 168 hours. Cardiac Enzymes: No results for input(s): CKTOTAL, CKMB, CKMBINDEX, TROPONINI in the last 168 hours. BNP (last 3 results) No results for input(s): PROBNP in the last 8760 hours. HbA1C: No results for input(s): HGBA1C in the last 72 hours. CBG: No results for input(s): GLUCAP in the last 168 hours. Lipid Profile: No results for input(s): CHOL, HDL, LDLCALC, TRIG, CHOLHDL, LDLDIRECT in the last 72 hours. Thyroid Function Tests: No results for input(s): TSH, T4TOTAL, FREET4, T3FREE, THYROIDAB in the last 72 hours. Anemia Panel: No results for input(s): VITAMINB12, FOLATE, FERRITIN, TIBC, IRON, RETICCTPCT in the last 72 hours. Urine analysis:    Component Value Date/Time   COLORURINE STRAW (A) 04/14/2019 1910   APPEARANCEUR CLEAR 04/14/2019 1910   LABSPEC 1.033 (H) 04/14/2019 1910   PHURINE 6.0 04/14/2019 1910   GLUCOSEU >=500 (A) 04/14/2019 1910   HGBUR SMALL (A) 04/14/2019 1910   BILIRUBINUR NEGATIVE 04/14/2019 1910   KETONESUR 80 (A) 04/14/2019 1910   PROTEINUR 30 (A) 04/14/2019 1910   UROBILINOGEN 0.2 02/25/2012 0010   NITRITE NEGATIVE 04/14/2019 1910    LEUKOCYTESUR SMALL (A) 04/14/2019 1910   Sepsis Labs: @LABRCNTIP (procalcitonin:4,lacticidven:4) ) Recent Results (from the past 240 hour(s))  SARS Coronavirus 2 Procedure [facility] order, Performed in [facility] hospital lab) Nasopharyngeal Nasopharyngeal Swab     Status: Abnormal   Collection Time: 04/08/19  8:15 AM   Specimen: Nasopharyngeal Swab  Result Value Ref Range Status   SARS Coronavirus 2 POSITIVE (A) NEGATIVE Final    Comment: RESULT CALLED TO, READ BACK BY AND VERIFIED WITH: HARTLEY A RN 1026 04/10/19 PHILLIPS C (NOTE) If result is NEGATIVE SARS-CoV-2 target nucleic acids are NOT DETECTED. The SARS-CoV-2 RNA is generally detectable in upper and lower  respiratory specimens during the acute phase of infection. The lowest  concentration of SARS-CoV-2 viral copies this assay can detect is 250  copies / mL. A negative result does not preclude SARS-CoV-2 infection  and should not be used as the sole basis for treatment or other  patient management decisions.  A negative result may occur with  improper specimen collection / handling, submission of specimen other  than nasopharyngeal swab, presence of viral mutation(s) within the  areas targeted by this assay, and inadequate number of viral copies  (<250 copies / mL). A negative result must be combined with clinical  observations, patient history, and epidemiological information. If result is POSITIVE SARS-CoV-2 target nucleic acids are DETECTED. T he SARS-CoV-2 RNA is generally detectable in upper and lower  respiratory specimens during the acute phase of infection.  Positive  results are indicative of active infection with SARS-CoV-2.  Clinical  correlation with patient history and other diagnostic information is  necessary to determine patient infection status.  Positive results do  not rule out bacterial infection or co-infection with other viruses. If result is PRESUMPTIVE POSTIVE SARS-CoV-2 nucleic acids MAY BE PRESENT.   A  presumptive positive result was obtained on the submitted specimen  and confirmed on repeat testing.  While 2019 novel coronavirus  (SARS-CoV-2) nucleic acids may be present in the submitted sample  additional confirmatory testing may be necessary for epidemiological  and / or clinical management purposes  to differentiate between  SARS-CoV-2 and other Sarbecovirus currently known to infect humans.  If clinically indicated additional testing with an alternate test  methodology [phone] is  advised. The SARS-CoV-2 RNA is generally  detectable in upper and lower respiratory specimens during the acute  phase of infection. The expected result is Negative. Fact Sheet for Patients:  BoilerBrush.com.cyhttps://www.fda.gov/media/136312/download Fact Sheet for Healthcare Providers: https://pope.com/https://www.fda.gov/media/136313/download This test is not yet approved or cleared by the [place] FDA and has been authorized for detection and/or diagnosis of SARS-CoV-2 by FDA under an Emergency Use Authorization (EUA).  This EUA will remain in effect (meaning this test can be used) for the duration of the COVID-19 declaration under Section 564(b)(1) of the Act, 21 U.S.C. section 360bbb-3(b)(1), unless the authorization is terminated or revoked sooner. Performed at [facility] [place], [address] [place], [place] [address]      Radiological Exams on Admission[facility]  Result Date: 04/14/2019 CLINICAL DATA:  Pt arrived via [facility] from home with CC near syncople episode. Pt denies injury or pain but endorses generalized weakness. Hx of Type 1 Diabetes. Pt last ate 0900 oatmeal and has not taken any insulin today. Per EMS Afebrile CBG 441 VSS. Pt is COVID POS tested 9/18 [facility] EXAM: PORTABLE CHEST 1 VIEW COMPARISON:  04/08/2019 FINDINGS: Subtle areas of hazy airspace opacity noted on the prior radiographs have improved. There are mostly resolved. No new lung abnormalities.  No pleural effusion  or pneumothorax. Heart, mediastinum hila are within normal limits. Skeletal structures are unremarkable. IMPRESSION: 1. Improved areas of multifocal lung opacity when compared to the prior chest radiograph. 2. No new abnormalities. Electronically Signed   By: Amie Portlandavid  Ormond M.D.   On: 04/14/2019 19:54    EKG: Independently reviewed.  Normal sinus rhythm.  Assessment/Plan Principal Problem:   Acute respiratory failure with hypoxia (HCC) Active Problems:   COVID-19 virus infection   Syncope and collapse   Diabetes mellitus type 1 (HCC)    1. Acute respiratory failure with hypoxia likely from COVID pneumonia -we will check inflammatory markers.  I have ordered Decadron and pharmacy to dose Remdesivir.  Closely monitor. 2. Diabetes mellitus type 1 with hyperglycemia -initial metabolic panel did show anion gap of 17 with low bicarb and urine showing ketones.  Patient did receive IV fluids and her home dose of long-acting insulin.  Will recheck metabolic panel and if there is persistent hyperglycemia with elevated anion gap will need patient on IV insulin with DKA protocol and also note that patient is on Decadron. 3. Syncope could be from hypoxia -closely monitor in telemetry for any abnormal rhythm also check orthostatics. 4. Dizziness -appears nonfocal.  Mostly positional.  Check orthostatics.  Given that patient is getting easily short of breath on exertion with COVID pneumonia and uncontrolled diabetes will need more than 2 midnight stay and inpatient status.   DVT prophylaxis: Lovenox. Code Status: Full code. Family Communication: Discussed with patient. Disposition Plan: Home. Consults called: None. Admission status: Inpatient.   Eduard ClosArshad N Ady Heimann MD [facility] Pager [phone].  If 7PM-7AM, please contact night-coverage [email or web address] Password [email or web address]  04/15/2019, 12:31 AM

## 2019-04-15 NOTE — ED Notes (Signed)
[facility] at bedside to transfer patient to [facility]. Patient A&O x4 and ambulatory at time of transfer. Patient nausea and vomiting has improved at this time.

## 2019-04-15 NOTE — Plan of Care (Signed)

## 2019-04-15 NOTE — ED Notes (Signed)
Patient resting peacefully with complaints of indigestion. MD made aware. Will continue to monitor patient.

## 2019-04-15 NOTE — Progress Notes (Signed)
Inpatient Diabetes Program Recommendations  AACE/ADA: New Consensus Statement on Inpatient Glycemic Control (2015)  Target Ranges:  Prepandial:   less than 140 mg/dL      Peak postprandial:   less than 180 mg/dL (1-2 hours)      Critically ill patients:  140 - 180 mg/dL   Lab Results  Component Value Date   GLUCAP 298 (H) 04/15/2019   HGBA1C 14.3 (H) 04/15/2019    Review of Glycemic Control Results for [name], [name] (MRN [id number]) as of 04/15/2019 16:03  Ref. Range 04/15/2019 07:34 04/15/2019 12:09 04/15/2019 15:50  Glucose-Capillary Latest Ref Range: 70 - 99 mg/dL 257 (H) 233 (H) 298 (H)   Diabetes history: Type 1 DM Outpatient Diabetes medications: Novolog 1:6 grams, 1 units for every 30 pts > 130 mg/dL, Tresiba 36 units QHS Current orders for Inpatient glycemic control: Lantus 42 units QHS, Novolog 6 units TID, Novolog 0-9 units TID Decadron 6 mg QHS  Inpatient Diabetes Program Recommendations:    Spoke with patient regarding outpatient diabetes management. Patient verifies doses above and reports DM began as GDM. She feels defeated, especially while trying to overcome this disease during the pandemic. She is followed by endocrinology in [name] and [name]. This practice has arrange for patient to receive medications by mail until May 2021.  Reviewed patient's current A1c of 14.3%. While talking with patient she states, "This is good for me, usually I am 15%." Explained what a A1c is and what it measures. Also reviewed goal A1c with patient, importance of good glucose control @ home, and blood sugar goals. Reviewed patho of DM, need for insulin, role of pancreas, vascular changes, impact of depression and anxiety in management of diabetes, stressed the anticipated and desired values for decreases in A1C for glycemic control, and comorbidites.  Patient uses [name] and is wanting to get the [name]. Explained that this could help with discovering  hypoglycemia. Patient aware and states that she usually has hypos at night, hence reduced dose of [place]. Patient continues to state "Im resistant, nothing has worked". When asked more specifically, patient denies counting carbohydrates and only eating "vegan", but then spoke of eating shrimp scampi and pasta with son. Denies eating anything with carbohydrates and really stressed how she had recently lost >100 lbs and this was something she was really proud of. Admits to significant anxiety and depression. Question if this correlates with lack of insulin use at home, even though patient denies. Encouraged to work on carb counting, especially since her Novolog at home is dosed according to consumed grams. Patient agreed that she would work on this.   Denies missing doses and reports frequent virtual visits of Q2 months and has another appointment on Monday. Plans to perform visit while inpatient.  Patient expresses frustration when reviewing current inpatient glucose trends along with insulin order management. Patient states, "I have been refusing Lantus this whole time. The night nurse told me it was [place]." Per [facility], patient received Lantus. Discussed with patient that physician would be contacted in order to be changed.  Secure chat sent to Dr Sloan Leiter to change order from Lantus to either Levemir or patient's Tyler Aas as a non-formulary order. Patient did not have a preference. Order changed.  Patient has no further questions at this time and plans to see endo on Monday.   Thanks, Bronson Curb, MSN, RNC-OB Diabetes Coordinator [phone] ([phone]-5p)

## 2019-04-15 NOTE — Progress Notes (Addendum)
PROGRESS NOTE                                                                                                                                                                                                             Patient Demographics:    [name], is a 48 y.o. female, DOB - February 22, 1971, ZDG:[id number]  Outpatient Primary MD for the patient is Patient, No Pcp Per   Admit date - 04/14/2019   LOS - 1  Chief Complaint  Patient presents with  . Weakness    COVID POSITIVE       Brief Narrative: Patient is a 48 y.o. female with PMHx of insulin-dependent DM, depression- diagnosed with COVID-19 on 9/18-presented with worsening exertional dyspnea, and a syncopal episode.  She was subsequently admitted to the hospitalist service.  See below for further details   Subjective:    [name] today claims that at rest she is fine-but continues to have dizziness, and exertional dyspnea with minimal activity.   Assessment  & Plan :   Acute Hypoxic Resp Failure due to Covid 19 Viral pneumonia: Remains on room air this morning-seems to have improved compared to yesterday-however still complains of significant exertional dyspnea.  Continue steroids and Remdesivir.  Fever: afebrile  O2 requirements: Room air  COVID-19 Labs: Recent Labs    04/15/19 0128 04/15/19 0755  DDIMER  --  1.49*  FERRITIN 150 135  CRP 2.3* 1.2*    Lab Results  Component Value Date   SARSCOV2NAA POSITIVE (A) 04/08/2019     COVID-19 Medications: Steroids: 9/24>> Remdesivir: 9/24>> Actemra: Not indicated Convalescent Plasma: Not indicated Research Studies:N/A  Other medications: Diuretics:Euvolemic-no need for lasix Antibiotics:Not needed as no evidence of bacterial infection  Prone/Incentive Spirometry: encouraged patient to lie prone for 3-4 hours at a time for a total of 16 hours a day, and to encourage incentive spirometry use  3-4/hour.  DVT Prophylaxis  :  Lovenox   Syncope: By history there appears to be a positional component-check orthostatics.  2D echo with preserved EF, lower extremity Doppler (preliminary) without any DVT.  Follow for now.  Insulin-dependent DM with uncontrolled hyperglycemia (A1c 14.3 on 04/15/2019): CBGs uncontrolled-as patient on steroids-but appears to have poorly controlled DM given A1c.  Increase Tresiba to 42 units, add 6 units of NovoLog with meals, change SSI to resistant  scale.  Depression: Stable-continue Zoloft  ABG:    Component Value Date/Time   HCO3 11.4 (L) 04/14/2019 2020   ACIDBASEDEF 14.0 (H) 04/14/2019 2020   O2SAT 61.7 04/14/2019 2020    Vent Settings: N/A  Condition -Stable  Family Communication  : None at bedside  Code Status :  Full Code  Diet :  Diet Order            Diet Carb Modified Fluid consistency: Thin; Room service appropriate? Yes  Diet effective now               Disposition Plan  :  Remain hospitalized  Barriers to discharge: Still with significant exertional dyspnea with minimal activity-needs to complete 5 days of intravenous Remdesivir.  Consults  :  None  Procedures  :  None  Antibiotics  :    Anti-infectives (From admission, onward)   Start     Dose/Rate Route Frequency Ordered Stop   04/16/19 1000  remdesivir 100 mg in sodium chloride 0.9 % 250 mL IVPB     100 mg 500 mL/hr over 30 Minutes Intravenous Daily 04/15/19 0110 04/20/19 0959   04/15/19 2200  remdesivir 100 mg in sodium chloride 0.9 % 250 mL IVPB  Status:  Discontinued     100 mg 500 mL/hr over 30 Minutes Intravenous Every 24 hours 04/15/19 0103 04/15/19 0110   04/15/19 0115  remdesivir 200 mg in sodium chloride 0.9 % 250 mL IVPB  Status:  Discontinued     200 mg 500 mL/hr over 30 Minutes Intravenous Once 04/15/19 0100 04/15/19 0103   04/15/19 0115  remdesivir 200 mg in sodium chloride 0.9 % 250 mL IVPB  Status:  Discontinued     200 mg 500 mL/hr over 30  Minutes Intravenous Once 04/15/19 0103 04/15/19 0110   04/15/19 0115  remdesivir 200 mg in sodium chloride 0.9 % 250 mL IVPB     200 mg 500 mL/hr over 30 Minutes Intravenous Once 04/15/19 0110 04/15/19 0356      Inpatient Medications  Scheduled Meds: . dexamethasone  6 mg Oral QHS  . enoxaparin (LOVENOX) injection  40 mg Subcutaneous QHS  . feeding supplement (ENSURE ENLIVE)  237 mL Oral BID BM  . gabapentin  100 mg Oral TID  . insulin aspart  0-20 Units Subcutaneous TID WC  . insulin aspart  0-5 Units Subcutaneous QHS  . insulin aspart  6 Units Subcutaneous TID WC  . insulin detemir  42 Units Subcutaneous QHS  . sertraline  100 mg Oral Daily   Continuous Infusions: . [START ON 04/16/2019] remdesivir 100 mg in NS 250 mL     PRN Meds:.acetaminophen **OR** acetaminophen, cyclobenzaprine, guaiFENesin-dextromethorphan, ondansetron **OR** ondansetron (ZOFRAN) IV   Time Spent in minutes  25   Jeoffrey Massed M.D on 04/15/2019 at 3:59 PM  To page go to [email or web address] - use universal password  [facility] -  Office  [phone]    Objective:   Vitals:   04/15/19 0500 04/15/19 0736 04/15/19 0800 04/15/19 1211  BP: 94/63 95/64  101/74  Pulse:  87 85 85  Resp:  Temp: 98.3 F (36.8 C) 98.1 F (36.7 C)  98.1 F (36.7 C)  TempSrc:  Oral  Oral  SpO2:  100% 100% 100%  Weight:      Height:        Wt Readings from Last 3 Encounters:  04/15/19 67.7 kg  07/15/16 62.7 kg  06/08/16 62.6 kg  Intake/Output Summary (Last 24 hours) at 04/15/2019 1559 Last data filed at 04/15/2019 1400 Gross per 24 hour  Intake 1490 ml  Output -  Net 1490 ml     Physical Exam Gen Exam:Alert awake-not in any distress HEENT:atraumatic, normocephalic Chest: B/L clear to auscultation anteriorly CVS:S1S2 regular Abdomen:soft non tender, non distended Extremities:no edema Neurology: Non focal Skin: no rash   Data Review:    CBC Recent Labs  Lab 04/14/19 1951 04/15/19  0755  WBC 7.5 8.3  HGB 14.6 13.6  HCT 42.0 38.0  PLT 334 306  MCV 85.7 85.0  MCH 29.8 30.4  MCHC 34.8 35.8  RDW 12.5 12.5  LYMPHSABS 1.4  --   MONOABS 0.7  --   EOSABS 0.2  --   BASOSABS 0.0  --     Chemistries  Recent Labs  Lab 04/14/19 1951 04/15/19 0755  NA 135 136  K 3.9 3.6  CL 106 109  CO2 12* 11*  GLUCOSE 384* 244*  BUN 17 15  CREATININE 0.78 0.68  CALCIUM 9.0 8.2*  AST 11* 13*  ALT 12 13  ALKPHOS 87 76  BILITOT 1.7* 1.4*   ------------------------------------------------------------------------------------------------------------------ No results for input(s): CHOL, HDL, LDLCALC, TRIG, CHOLHDL, LDLDIRECT in the last 72 hours.  Lab Results  Component Value Date   HGBA1C 14.3 (H) 04/15/2019   ------------------------------------------------------------------------------------------------------------------ No results for input(s): TSH, T4TOTAL, T3FREE, THYROIDAB in the last 72 hours.  Invalid input(s): FREET3 ------------------------------------------------------------------------------------------------------------------ Recent Labs    04/15/19 0128 04/15/19 0755  FERRITIN 150 135    Coagulation profile No results for input(s): INR, PROTIME in the last 168 hours.  Recent Labs    04/15/19 0755  DDIMER 1.49*    Cardiac Enzymes No results for input(s): CKMB, TROPONINI, MYOGLOBIN in the last 168 hours.  Invalid input(s): CK ------------------------------------------------------------------------------------------------------------------ No results found for: BNP  Micro Results Recent Results (from the past 240 hour(s))  SARS Coronavirus 2 [facility](Hospital order, Per[facility]) Nasopharyngeal Nasopharyngeal Swab     Status: Abnormal   Collection Time: 04/08/19  8:15 AM   Specimen: Nasopharyngeal Swab  Result Value Ref Range Status   SARS Coronavirus 2 POSITIVE (A) NEGATIVE Final    Comment: RESULT CALLED TO, READ BACK BY  AND VERIFIED WITH: HARTLEY A RN [id number] PHILLIPS C (NOTE) If result is NEGATIVE SARS-CoV-2 target nucleic acids are NOT DETECTED. The SARS-CoV-2 RNA is generally detectable in upper and lower  respiratory specimens during the acute phase of infection. The lowest  concentration of SARS-CoV-2 viral copies this assay can detect is 250  copies / mL. A negative result does not preclude SARS-CoV-2 infection  and should not be used as the sole basis for treatment or other  patient management decisions.  A negative result may occur with  improper specimen collection / handling, submission of specimen other  than nasopharyngeal swab, presence of viral mutation(s) within the  areas targeted by this assay, and inadequate number of viral copies  (<250 copies / mL). A negative result must be combined with clinical  observations, patient history, and epidemiological information. If result is POSITIVE SARS-CoV-2 target nucleic acids are DETECTED. T he SARS-CoV-2 RNA is generally detectable in upper and lower  respiratory specimens during the acute phase of infection.  Positive  results are indicative of active infection with SARS-CoV-2.  Clinical  correlation with patient history and other diagnostic information is  necessary to determine patient infection status.  Positive results do  not rule out bacterial  infection or co-infection with other viruses. If result is PRESUMPTIVE POSTIVE SARS-CoV-2 nucleic acids MAY BE PRESENT.   A presumptive positive result was obtained on the submitted specimen  and confirmed on repeat testing.  While 2019 novel coronavirus  (SARS-CoV-2) nucleic acids may be present in the submitted sample  additional confirmatory testing may be necessary for epidemiological  and / or clinical management purposes  to differentiate between  SARS-CoV-2 and other Sarbecovirus currently known to infect humans.  If clinically indicated additional testing with an alternate test   methodology [phone]) is  advised. The SARS-CoV-2 RNA is generally  detectable in upper and lower respiratory specimens during the acute  phase of infection. The expected result is Negative. Fact Sheet for Patients:  StrictlyIdeas.no Fact Sheet for Healthcare Providers: BankingDealers.co.za This test is not yet approved or cleared by the [place] FDA and has been authorized for detection and/or diagnosis of SARS-CoV-2 by FDA under an Emergency Use Authorization (EUA).  This EUA will remain in effect (meaning this test can be used) for the duration of the COVID-19 declaration under Section 564(b)(1) of the Act, 21 U.S.C. section 360bbb-3(b)(1), unless the authorization is terminated or revoked sooner. Performed at [facility], [address] [place], [place] [address]   MRSA PCR Screening     Status: None   Collection Time: 04/15/19  5:51 AM   Specimen: Nasopharyngeal  Result Value Ref Range Status   MRSA by PCR NEGATIVE NEGATIVE Final    Comment:        The GeneXpert MRSA Assay (FDA approved for NASAL specimens only), is one component of a comprehensive MRSA colonization surveillance program. It is not intended to diagnose MRSA infection nor to guide or monitor treatment for MRSA infections. Performed at [facility], [place] [address] [place], [place] [address]     Radiology Reports Dg Chest Port 1 View  Result Date: 04/14/2019 CLINICAL DATA:  Pt arrived via [facility] from home with CC near syncople episode. Pt denies injury or pain but endorses generalized weakness. Hx of Type 1 Diabetes. Pt last ate 0900 oatmeal and has not taken any insulin today. Per EMS Afebrile CBG 441 VSS. Pt is COVID POS tested 9/18 [facility] EXAM: PORTABLE CHEST 1 VIEW COMPARISON:  04/08/2019 FINDINGS: Subtle areas of hazy airspace opacity noted on the prior radiographs have improved. There are mostly resolved.  No new lung abnormalities.  No pleural effusion or pneumothorax. Heart, mediastinum hila are within normal limits. Skeletal structures are unremarkable. IMPRESSION: 1. Improved areas of multifocal lung opacity when compared to the prior chest radiograph. 2. No new abnormalities. Electronically Signed   By: Lajean Manes M.D.   On: 04/14/2019 19:54   Dg Chest Portable 1 View  Result Date: 04/08/2019 CLINICAL DATA:  Cough and shortness of breath for 1 week. Loss of taste. EXAM: PORTABLE CHEST 1 VIEW COMPARISON:  None. FINDINGS: The heart size and mediastinal contours are within normal limits. Mild airspace disease is seen in the left lower lung and right midlung suspicious for multifocal pneumonia, possibly viral pneumonia. No evidence of pleural effusion. IMPRESSION: Mild multifocal airspace disease in left lower lung and right midlung, suspicious for multifocal pneumonia, possibly viral pneumonia. Electronically Signed   By: Marlaine Hind M.D.   On: 04/08/2019 08:29

## 2019-04-15 NOTE — ED Notes (Signed)
[facility] contacted for transport. Paperwork printed and at bedside. Will continue to monitor patient.

## 2019-04-15 NOTE — ED Notes (Signed)
Alerted that patient was vomiting "blood." Assessed patient who was complaining of nausea and vomiting. Emesis noted in trash can but does not appear to have blood in it. Will administer medication to help with nausea and vomiting.

## 2019-04-16 DIAGNOSIS — R55 Syncope and collapse: Secondary | ICD-10-CM

## 2019-04-16 DIAGNOSIS — E109 Type 1 diabetes mellitus without complications: Secondary | ICD-10-CM

## 2019-04-16 LAB — COMPREHENSIVE METABOLIC PANEL
ALT: 9 U/L (ref 0–44)
AST: 12 U/L — ABNORMAL LOW (ref 15–41)
Albumin: 3 g/dL — ABNORMAL LOW (ref 3.5–5.0)
Alkaline Phosphatase: 70 U/L (ref 38–126)
Anion gap: 7 (ref 5–15)
BUN: 18 mg/dL (ref 6–20)
CO2: 20 mmol/L — ABNORMAL LOW (ref 22–32)
Calcium: 8.5 mg/dL — ABNORMAL LOW (ref 8.9–10.3)
Chloride: 109 mmol/L (ref 98–111)
Creatinine, Ser: 0.52 mg/dL (ref 0.44–1.00)
GFR calc Af Amer: >60 mL/min (ref >60)
GFR calc non Af Amer: >60 mL/min (ref >60)
Glucose, Bld: 305 mg/dL — ABNORMAL HIGH (ref 70–99)
Potassium: 3.3 mmol/L — ABNORMAL LOW (ref 3.5–5.1)
Sodium: 136 mmol/L (ref 135–145)
Total Bilirubin: 0.7 mg/dL (ref 0.3–1.2)
Total Protein: 5.9 g/dL — ABNORMAL LOW (ref 6.5–8.1)

## 2019-04-16 LAB — C-REACTIVE PROTEIN: CRP: 1.5 mg/dL — ABNORMAL HIGH (ref <1.0)

## 2019-04-16 LAB — GLUCOSE, CAPILLARY
Glucose-Capillary: 275 mg/dL — ABNORMAL HIGH (ref 70–99)
Glucose-Capillary: 272 mg/dL — ABNORMAL HIGH (ref 70–99)

## 2019-04-16 LAB — CBC
HCT: 35.5 % — ABNORMAL LOW (ref 36.0–46.0)
Hemoglobin: 12.8 g/dL (ref 12.0–15.0)
MCH: 30.1 pg (ref 26.0–34.0)
MCHC: 36.1 g/dL — ABNORMAL HIGH (ref 30.0–36.0)
MCV: 83.5 fL (ref 80.0–100.0)
Platelets: 281 10*3/uL (ref 150–400)
RBC: 4.25 MIL/uL (ref 3.87–5.11)
RDW: 12.5 % (ref 11.5–15.5)
WBC: 7.8 10*3/uL (ref 4.0–10.5)
nRBC: 0 % (ref 0.0–0.2)

## 2019-04-16 LAB — D-DIMER, QUANTITATIVE: D-Dimer, Quant: 1.19 ug/mL-FEU — ABNORMAL HIGH (ref 0.00–0.50)

## 2019-04-16 LAB — HIV ANTIBODY (ROUTINE TESTING W REFLEX): HIV Screen 4th Generation wRfx: NONREACTIVE

## 2019-04-16 LAB — FERRITIN: Ferritin: 128 ng/mL (ref 11–307)

## 2019-04-16 MED ORDER — TRAMADOL HCL 50 MG PO TABS
50.0000 mg | ORAL_TABLET | Freq: Four times a day (QID) | ORAL | Status: DC | PRN
  Administered 2019-04-16 – 2019-04-19 (×7): 50 mg via ORAL
  Filled 2019-04-16 (×7): qty 1

## 2019-04-16 MED ORDER — INSULIN DETEMIR 100 UNIT/ML ~~LOC~~ SOLN
46.0000 [IU] | Freq: Every day | SUBCUTANEOUS | Status: DC
  Administered 2019-04-16 – 2019-04-19 (×4): 46 [IU] via SUBCUTANEOUS
  Filled 2019-04-16 (×5): qty 0.46

## 2019-04-16 MED ORDER — POTASSIUM CHLORIDE CRYS ER 20 MEQ PO TBCR
40.0000 meq | EXTENDED_RELEASE_TABLET | Freq: Once | ORAL | Status: AC
  Administered 2019-04-16: 40 meq via ORAL
  Filled 2019-04-16: qty 2

## 2019-04-16 MED ORDER — FLUCONAZOLE 100 MG PO TABS
100.0000 mg | ORAL_TABLET | Freq: Every day | ORAL | Status: AC
  Administered 2019-04-16 – 2019-04-18 (×3): 100 mg via ORAL
  Filled 2019-04-16 (×3): qty 1

## 2019-04-16 NOTE — Progress Notes (Signed)
pt has thick green discharge coming from vagina, states he is having buring and is prone to yeast infections notified MD Sloan Leiter

## 2019-04-16 NOTE — Progress Notes (Signed)
PROGRESS NOTE                                                                                                                                                                                                             Patient Demographics:    [name], is a 48 y.o. female, DOB - 05/06/1971, ZOX:[id number]  Outpatient Primary MD for the patient is Patient, No Pcp Per   Admit date - 04/14/2019   LOS - 2  Chief Complaint  Patient presents with   Weakness    COVID POSITIVE       Brief Narrative: Patient is a 48 y.o. female with PMHx of insulin-dependent DM, depression- diagnosed with COVID-19 on 9/18-presented with worsening exertional dyspnea, and a syncopal episode.  She was subsequently admitted to the hospitalist service.  See below for further details   Subjective:    Natalie Ferrettingela Rossner today feels overall better-she has less dizziness with standing today.  Complains of vaginal discharge this morning.   Assessment  & Plan :   Acute Hypoxic Resp Failure due to Covid 19 Viral pneumonia: Overall much better-still complains of some amount of exertional dyspnea but clearly better-remains on room air-continue steroids and Remdesivir.  Fever: afebrile  O2 requirements: Room air  COVID-19 Labs: Recent Labs    04/15/19 0128 04/15/19 0755 04/16/19 0107  DDIMER  --  1.49* 1.19*  FERRITIN 150 135 128  CRP 2.3* 1.2* 1.5*    Lab Results  Component Value Date   SARSCOV2NAA POSITIVE (A) 04/08/2019     COVID-19 Medications: Steroids: 9/24>> Remdesivir: 9/24>> Actemra: Not indicated Convalescent Plasma: Not indicated Research Studies:N/A  Other medications: Diuretics:Euvolemic-no need for lasix Antibiotics:Not needed as no evidence of bacterial infection  Prone/Incentive Spirometry: encouraged patient to lie prone for 3-4 hours at a time for a total of 16 hours a day, and to encourage incentive spirometry use  3-4/hour.  DVT Prophylaxis  :  Lovenox   Syncope: Secondary to orthostatic hypotension-orthostatic vital signs are negative today although she still feels little dizzy on standing.  2D echo with preserved EF, lower extremity Doppler without any DVT.  Place thigh-high TED stockings-and recheck orthostatic vital signs tomorrow.  Okay to stop IV fluids-as her volume status is stable  Insulin-dependent DM with uncontrolled hyperglycemia (A1c 14.3 on 04/15/2019): CBGs remain uncontrolled-likely secondary to steroids-but it  appears that she has uncontrolled DM at baseline-see A1c.  Increase Tresiba to 46 units, increase pre-meal scheduled NovoLog to 6 units with meals-continue resistant SSI.  Follow and adjust accordingly.   CBG (last 3)  Recent Labs    04/15/19 1550 04/15/19 2005 04/16/19 0832  GLUCAP 298* 274* 275*    Depression: Stable-continue Zoloft  Candida vaginitis: Try Diflucan x3 days.  ABG:    Component Value Date/Time   HCO3 11.4 (L) 04/14/2019 2020   ACIDBASEDEF 14.0 (H) 04/14/2019 2020   O2SAT 61.7 04/14/2019 2020    Vent Settings: N/A  Condition -Stable  Family Communication  : None at bedside  Code Status :  Full Code  Diet :  Diet Order            Diet Carb Modified Fluid consistency: Thin; Room service appropriate? Yes  Diet effective now               Disposition Plan  :  Remain hospitalized  Barriers to discharge: Orthostatic hypotension, continues to have exertional dyspnea-needs to complete a 5-day course of Remdesivir.  Consults  :  None  Procedures  :  None  Antibiotics  :    Anti-infectives (From admission, onward)   Start     Dose/Rate Route Frequency Ordered Stop   04/16/19 1000  remdesivir 100 mg in sodium chloride 0.9 % 250 mL IVPB     100 mg 500 mL/hr over 30 Minutes Intravenous Daily 04/15/19 0110 04/20/19 0959   04/16/19 1000  fluconazole (DIFLUCAN) tablet 100 mg     100 mg Oral Daily 04/16/19 0759 04/19/19 0959   04/15/19 2200   remdesivir 100 mg in sodium chloride 0.9 % 250 mL IVPB  Status:  Discontinued     100 mg 500 mL/hr over 30 Minutes Intravenous Every 24 hours 04/15/19 0103 04/15/19 0110   04/15/19 0115  remdesivir 200 mg in sodium chloride 0.9 % 250 mL IVPB  Status:  Discontinued     200 mg 500 mL/hr over 30 Minutes Intravenous Once 04/15/19 0100 04/15/19 0103   04/15/19 0115  remdesivir 200 mg in sodium chloride 0.9 % 250 mL IVPB  Status:  Discontinued     200 mg 500 mL/hr over 30 Minutes Intravenous Once 04/15/19 0103 04/15/19 0110   04/15/19 0115  remdesivir 200 mg in sodium chloride 0.9 % 250 mL IVPB     200 mg 500 mL/hr over 30 Minutes Intravenous Once 04/15/19 0110 04/15/19 0356      Inpatient Medications  Scheduled Meds:  dexamethasone  6 mg Oral QHS   enoxaparin (LOVENOX) injection  40 mg Subcutaneous QHS   feeding supplement (ENSURE ENLIVE)  237 mL Oral BID BM   fluconazole  100 mg Oral Daily   gabapentin  100 mg Oral TID   insulin aspart  0-20 Units Subcutaneous TID WC   insulin aspart  0-5 Units Subcutaneous QHS   insulin aspart  6 Units Subcutaneous TID WC   insulin detemir  46 Units Subcutaneous QHS   sertraline  100 mg Oral Daily   Continuous Infusions:  remdesivir 100 mg in NS 250 mL 100 mg (04/16/19 0937)   PRN Meds:.acetaminophen **OR** acetaminophen, cyclobenzaprine, guaiFENesin-dextromethorphan, ondansetron **OR** ondansetron (ZOFRAN) IV   Time Spent in minutes  25   Jeoffrey Massed M.D on 04/16/2019 at 11:28 AM  To page go to [email or web address] - use universal password  [facility] -  Office  [phone]    Objective:   Vitals:  04/15/19 1600 04/15/19 2008 04/16/19 0359 04/16/19 0829  BP: 110/73   (!) 81/71  Pulse: 87   79  Resp: (!) 23   20  Temp: 98.2 F (36.8 C) 98.6 F (37 C) 98.2 F (36.8 C) 98.4 F (36.9 C)  TempSrc: Oral Oral Oral Oral  SpO2:    100%  Weight:      Height:        Wt Readings from Last 3 Encounters:  04/15/19 67.7  kg  07/15/16 62.7 kg  06/08/16 62.6 kg     Intake/Output Summary (Last 24 hours) at 04/16/2019 1128 Last data filed at 04/16/2019 0300 Gross per 24 hour  Intake 527.04 ml  Output 3 ml  Net 524.04 ml     Physical Exam Gen Exam:Alert awake-not in any distress HEENT:atraumatic, normocephalic Chest: B/L clear to auscultation anteriorly CVS:S1S2 regular Abdomen:soft non tender, non distended Extremities:no edema Neurology: Non focal Skin: no rash   Data Review:    CBC Recent Labs  Lab 04/14/19 1951 04/15/19 0755 04/16/19 0107  WBC 7.5 8.3 7.8  HGB 14.6 13.6 12.8  HCT 42.0 38.0 35.5*  PLT 334 306 281  MCV 85.7 85.0 83.5  MCH 29.8 30.4 30.1  MCHC 34.8 35.8 36.1*  RDW 12.5 12.5 12.5  LYMPHSABS 1.4  --   --   MONOABS 0.7  --   --   EOSABS 0.2  --   --   BASOSABS 0.0  --   --     Chemistries  Recent Labs  Lab 04/14/19 1951 04/15/19 0755 04/16/19 0107  NA 135 136 136  K 3.9 3.6 3.3*  CL 106 109 109  CO2 12* 11* 20*  GLUCOSE 384* 244* 305*  BUN CREATININE 0.78 0.68 0.52  CALCIUM 9.0 8.2* 8.5*  AST 11* 13* 12*  ALT ALKPHOS 87 76 70  BILITOT 1.7* 1.4* 0.7   ------------------------------------------------------------------------------------------------------------------ No results for input(s): CHOL, HDL, LDLCALC, TRIG, CHOLHDL, LDLDIRECT in the last 72 hours.  Lab Results  Component Value Date   HGBA1C 14.3 (H) 04/15/2019   ------------------------------------------------------------------------------------------------------------------ No results for input(s): TSH, T4TOTAL, T3FREE, THYROIDAB in the last 72 hours.  Invalid input(s): FREET3 ------------------------------------------------------------------------------------------------------------------ Recent Labs    04/15/19 0755 04/16/19 0107  FERRITIN 135 128    Coagulation profile No results for input(s): INR, PROTIME in the last 168 hours.  Recent Labs     04/15/19 0755 04/16/19 0107  DDIMER 1.49* 1.19*    Cardiac Enzymes No results for input(s): CKMB, TROPONINI, MYOGLOBIN in the last 168 hours.  Invalid input(s): CK ------------------------------------------------------------------------------------------------------------------ No results found for: BNP  Micro Results Recent Results (from the past 240 hour(s))  SARS Coronavirus 2 [facility] order, Performed in [facility] hospital lab) Nasopharyngeal Nasopharyngeal Swab     Status: Abnormal   Collection Time: 04/08/19  8:15 AM   Specimen: Nasopharyngeal Swab  Result Value Ref Range Status   SARS Coronavirus 2 POSITIVE (A) NEGATIVE Final    Comment: RESULT CALLED TO, READ BACK BY AND VERIFIED WITH: HARTLEY A RN [id number] PHILLIPS C (NOTE) If result is NEGATIVE SARS-CoV-2 target nucleic acids are NOT DETECTED. The SARS-CoV-2 RNA is generally detectable in upper and lower  respiratory specimens during the acute phase of infection. The lowest  concentration of SARS-CoV-2 viral copies this assay can detect is 250  copies / mL. A negative result does not preclude SARS-CoV-2 infection  and should not be used as the sole basis for  treatment or other  patient management decisions.  A negative result may occur with  improper specimen collection / handling, submission of specimen other  than nasopharyngeal swab, presence of viral mutation(s) within the  areas targeted by this assay, and inadequate number of viral copies  (<250 copies / mL). A negative result must be combined with clinical  observations, patient history, and epidemiological information. If result is POSITIVE SARS-CoV-2 target nucleic acids are DETECTED. T he SARS-CoV-2 RNA is generally detectable in upper and lower  respiratory specimens during the acute phase of infection.  Positive  results are indicative of active infection with SARS-CoV-2.  Clinical  correlation with patient history and other diagnostic information  is  necessary to determine patient infection status.  Positive results do  not rule out bacterial infection or co-infection with other viruses. If result is PRESUMPTIVE POSTIVE SARS-CoV-2 nucleic acids MAY BE PRESENT.   A presumptive positive result was obtained on the submitted specimen  and confirmed on repeat testing.  While 2019 novel coronavirus  (SARS-CoV-2) nucleic acids may be present in the submitted sample  additional confirmatory testing may be necessary for epidemiological  and / or clinical management purposes  to differentiate between  SARS-CoV-2 and other Sarbecovirus currently known to infect humans.  If clinically indicated additional testing with an alternate test  methodology [phone]) is  advised. The SARS-CoV-2 RNA is generally  detectable in upper and lower respiratory specimens during the acute  phase of infection. The expected result is Negative. Fact Sheet for Patients:  BoilerBrush.com.cy Fact Sheet for Healthcare Providers: https://pope.com/ This test is not yet approved or cleared by the [place] FDA and has been authorized for detection and/or diagnosis of SARS-CoV-2 by FDA under an Emergency Use Authorization (EUA).  This EUA will remain in effect (meaning this test can be used) for the duration of the COVID-19 declaration under Section 564(b)(1) of the Act, 21 U.S.C. section 360bbb-3(b)(1), unless the authorization is terminated or revoked sooner. Performed at [facility], [address] [place], [place] [address]   MRSA PCR Screening     Status: None   Collection Time: 04/15/19  5:51 AM   Specimen: Nasopharyngeal  Result Value Ref Range Status   MRSA by PCR NEGATIVE NEGATIVE Final    Comment:        The GeneXpert MRSA Assay (FDA approved for NASAL specimens only), is one component of a comprehensive MRSA colonization surveillance program. It is not intended to diagnose  MRSA infection nor to guide or monitor treatment for MRSA infections. Performed at [facility], [address], [place], [place] [address]     Radiology Reports Dg Chest Port 1 View  Result Date: 04/14/2019 CLINICAL DATA:  Pt arrived via [facility] from home with CC near syncople episode. Pt denies injury or pain but endorses generalized weakness. Hx of Type 1 Diabetes. Pt last ate 0900 oatmeal and has not taken any insulin today. Per EMS Afebrile CBG 441 VSS. Pt is COVID POS tested 9/18 [facility] EXAM: PORTABLE CHEST 1 VIEW COMPARISON:  04/08/2019 FINDINGS: Subtle areas of hazy airspace opacity noted on the prior radiographs have improved. There are mostly resolved. No new lung abnormalities.  No pleural effusion or pneumothorax. Heart, mediastinum hila are within normal limits. Skeletal structures are unremarkable. IMPRESSION: 1. Improved areas of multifocal lung opacity when compared to the prior chest radiograph. 2. No new abnormalities. Electronically Signed   By: Renard Hamper.D.  On: 04/14/2019 19:54   Dg Chest Portable 1 View  Result Date: 04/08/2019 CLINICAL DATA:  Cough and shortness of breath for 1 week. Loss of taste. EXAM: PORTABLE CHEST 1 VIEW COMPARISON:  None. FINDINGS: The heart size and mediastinal contours are within normal limits. Mild airspace disease is seen in the left lower lung and right midlung suspicious for multifocal pneumonia, possibly viral pneumonia. No evidence of pleural effusion. IMPRESSION: Mild multifocal airspace disease in left lower lung and right midlung, suspicious for multifocal pneumonia, possibly viral pneumonia. Electronically Signed   By: Marlaine Hind M.D.   On: 04/08/2019 08:29   Vas [place] Lower Extremity Venous (dvt)  Result Date: 04/16/2019  Lower Venous Study Indications: Pain, and Covid-19 ++.  Comparison Study: No priors Performing Technologist: Velva Harman Sturdivant RDMS, RVT  Examination Guidelines: A complete evaluation  includes B-mode imaging, spectral Doppler, color Doppler, and power Doppler as needed of all accessible portions of each vessel. Bilateral testing is considered an integral part of a complete examination. Limited examinations for reoccurring indications may be performed as noted.  +---------+---------------+---------+-----------+----------+--------------+  RIGHT     Compressibility Phasicity Spontaneity Properties Thrombus Aging  +---------+---------------+---------+-----------+----------+--------------+  CFV       Full            Yes       Yes                                    +---------+---------------+---------+-----------+----------+--------------+  SFJ       Full                                                             +---------+---------------+---------+-----------+----------+--------------+  FV Prox   Full                                                             +---------+---------------+---------+-----------+----------+--------------+  FV Mid    Full                                                             +---------+---------------+---------+-----------+----------+--------------+  FV Distal Full                                                             +---------+---------------+---------+-----------+----------+--------------+  PFV       Full                                                             +---------+---------------+---------+-----------+----------+--------------+  POP       Full            Yes       Yes                                    +---------+---------------+---------+-----------+----------+--------------+  PTV       Full                                                             +---------+---------------+---------+-----------+----------+--------------+  PERO      Full                                                             +---------+---------------+---------+-----------+----------+--------------+    +---------+---------------+---------+-----------+----------+--------------+  LEFT      Compressibility Phasicity Spontaneity Properties Thrombus Aging  +---------+---------------+---------+-----------+----------+--------------+  CFV       Full            Yes       Yes                                    +---------+---------------+---------+-----------+----------+--------------+  SFJ       Full                                                             +---------+---------------+---------+-----------+----------+--------------+  FV Prox   Full                                                             +---------+---------------+---------+-----------+----------+--------------+  FV Mid    Full                                                             +---------+---------------+---------+-----------+----------+--------------+  FV Distal Full                                                             +---------+---------------+---------+-----------+----------+--------------+  PFV       Full                                                             +---------+---------------+---------+-----------+----------+--------------+  POP       Full            Yes       Yes                                    +---------+---------------+---------+-----------+----------+--------------+  PTV       Full                                                             +---------+---------------+---------+-----------+----------+--------------+  PERO      Full                                                             +---------+---------------+---------+-----------+----------+--------------+     Summary: Right: There is no evidence of deep vein thrombosis in the lower extremity. No cystic structure found in the popliteal fossa. Left: There is no evidence of deep vein thrombosis in the lower extremity. No cystic structure found in the popliteal fossa.  *See table(s) above for measurements and observations. Electronically signed by Gretta Began MD on 04/16/2019 at 3:58:47 AM.    Final

## 2019-04-17 LAB — CBC
HCT: 37.8 % (ref 36.0–46.0)
Hemoglobin: 13.5 g/dL (ref 12.0–15.0)
MCH: 30.2 pg (ref 26.0–34.0)
MCHC: 35.7 g/dL (ref 30.0–36.0)
MCV: 84.6 fL (ref 80.0–100.0)
Platelets: 263 10*3/uL (ref 150–400)
RBC: 4.47 MIL/uL (ref 3.87–5.11)
RDW: 13 % (ref 11.5–15.5)
WBC: 7 10*3/uL (ref 4.0–10.5)
nRBC: 0 % (ref 0.0–0.2)

## 2019-04-17 LAB — COMPREHENSIVE METABOLIC PANEL
ALT: 12 U/L (ref 0–44)
AST: 14 U/L — ABNORMAL LOW (ref 15–41)
Albumin: 3.1 g/dL — ABNORMAL LOW (ref 3.5–5.0)
Alkaline Phosphatase: 72 U/L (ref 38–126)
Anion gap: 9 (ref 5–15)
BUN: 26 mg/dL — ABNORMAL HIGH (ref 6–20)
CO2: 23 mmol/L (ref 22–32)
Calcium: 8.4 mg/dL — ABNORMAL LOW (ref 8.9–10.3)
Chloride: 102 mmol/L (ref 98–111)
Creatinine, Ser: 0.53 mg/dL (ref 0.44–1.00)
GFR calc Af Amer: >60 mL/min (ref >60)
GFR calc non Af Amer: >60 mL/min (ref >60)
Glucose, Bld: 260 mg/dL — ABNORMAL HIGH (ref 70–99)
Potassium: 3.2 mmol/L — ABNORMAL LOW (ref 3.5–5.1)
Sodium: 134 mmol/L — ABNORMAL LOW (ref 135–145)
Total Bilirubin: 0.5 mg/dL (ref 0.3–1.2)
Total Protein: 6.4 g/dL — ABNORMAL LOW (ref 6.5–8.1)

## 2019-04-17 LAB — D-DIMER, QUANTITATIVE: D-Dimer, Quant: 0.96 ug/mL-FEU — ABNORMAL HIGH (ref 0.00–0.50)

## 2019-04-17 LAB — FERRITIN: Ferritin: 131 ng/mL (ref 11–307)

## 2019-04-17 LAB — C-REACTIVE PROTEIN: CRP: 1 mg/dL — ABNORMAL HIGH (ref <1.0)

## 2019-04-17 MED ORDER — POTASSIUM CHLORIDE CRYS ER 20 MEQ PO TBCR
20.0000 meq | EXTENDED_RELEASE_TABLET | Freq: Once | ORAL | Status: AC
  Administered 2019-04-17: 20 meq via ORAL
  Filled 2019-04-17: qty 1

## 2019-04-17 MED ORDER — INSULIN ASPART 100 UNIT/ML ~~LOC~~ SOLN
8.0000 [IU] | Freq: Three times a day (TID) | SUBCUTANEOUS | Status: DC
  Administered 2019-04-17 – 2019-04-19 (×6): 8 [IU] via SUBCUTANEOUS

## 2019-04-17 MED ORDER — LACTATED RINGERS IV SOLN
INTRAVENOUS | Status: AC
  Administered 2019-04-17: 10:00:00 via INTRAVENOUS

## 2019-04-17 MED ORDER — MAGNESIUM SULFATE IN D5W 1-5 GM/100ML-% IV SOLN
1.0000 g | Freq: Once | INTRAVENOUS | Status: AC
  Administered 2019-04-17: 1 g via INTRAVENOUS
  Filled 2019-04-17: qty 100

## 2019-04-17 MED ORDER — LACTATED RINGERS IV SOLN: INTRAVENOUS | Status: AC

## 2019-04-17 MED ORDER — DEXAMETHASONE 4 MG PO TABS
4.0000 mg | ORAL_TABLET | Freq: Every day | ORAL | Status: DC
  Administered 2019-04-17: 4 mg via ORAL
  Filled 2019-04-17: qty 1

## 2019-04-17 MED ORDER — POTASSIUM CHLORIDE CRYS ER 20 MEQ PO TBCR
40.0000 meq | EXTENDED_RELEASE_TABLET | Freq: Once | ORAL | Status: AC
  Administered 2019-04-17: 40 meq via ORAL
  Filled 2019-04-17: qty 2

## 2019-04-17 NOTE — Progress Notes (Signed)
PROGRESS NOTE                                                                                                                                                                                                             Patient Demographics:    [name], is a 48 y.o. female, DOB - 06-13-1971, ZTI:[id number]  Outpatient Primary MD for the patient is Patient, No Pcp Per   Admit date - 04/14/2019   LOS - 3  Chief Complaint  Patient presents with   Weakness    COVID POSITIVE       Brief Narrative: Patient is a 48 y.o. female with PMHx of insulin-dependent DM, depression- diagnosed with COVID-19 on 9/18-presented with worsening exertional dyspnea, and a syncopal episode.  She was subsequently admitted to the hospitalist service.  See below for further details   Subjective:   Patient in bed, appears comfortable, denies any headache, no fever, no chest pain or pressure, no shortness of breath , no abdominal pain. No focal weakness.    Assessment  & Plan :   Covid 19 Viral pneumonia and gastroenteritis: Currently on steroids and Remdisvir, no oxygen need, remains stable on room air.  Continue supportive care.  Inflammatory markers are stable.  Advance activity and monitor.  GI symptoms completely resolved.  COVID-19 Labs:  Recent Labs    04/15/19 0755 04/16/19 0107 04/17/19 0058  DDIMER 1.49* 1.19* 0.96*  FERRITIN 135 128 131  CRP 1.2* 1.5* 1.0*    Lab Results  Component Value Date   SARSCOV2NAA POSITIVE (A) 04/08/2019     COVID-19 Medications: Steroids: 9/24>> Remdesivir: 9/24>> Actemra: Not indicated Convalescent Plasma: Not indicated Research Studies:N/A   Syncope: Secondary to orthostatic hypotension-caused by dehydration due to diarrhea.  She had mild COVID-19 induced gastroenteritis which has resolved.  She is still mildly orthostatic on 04/17/2019, will give her IV fluid bolus along with  maintenance.  Advance activity and monitor blood pressure.  Hypokalemia.  Replaced.    Insulin-dependent DM with uncontrolled hyperglycemia (A1c 14.3 on 04/15/2019): Seemly poorly controlled outpatient due to hyperglycemia A1c of 14.3, currently on Tresiba and sliding scale, pre-meal NovoLog increased for better control.  Steroids being tapered will monitor closely.  CBG (last 3)  Recent Labs    04/15/19 2005 04/16/19 0832 04/16/19 1143  GLUCAP 274* 275* 272*  Lab Results  Component Value Date   HGBA1C 14.3 (H) 04/15/2019    Depression: Stable-continue Zoloft  Candida vaginitis: Try Diflucan x3 days.   Condition -Stable  Family Communication  : None at bedside  Code Status :  Full Code  Diet :  Diet Order            Diet Carb Modified Fluid consistency: Thin; Room service appropriate? Yes  Diet effective now               Disposition Plan  :  Remain hospitalized  Barriers to discharge: Orthostatic hypotension   Consults  :  None  Procedures  :  None  Antibiotics  :    Anti-infectives (From admission, onward)   Start     Dose/Rate Route Frequency Ordered Stop   04/16/19 1000  remdesivir 100 mg in sodium chloride 0.9 % 250 mL IVPB     100 mg 500 mL/hr over 30 Minutes Intravenous Daily 04/15/19 0110 04/20/19 0959   04/16/19 1000  fluconazole (DIFLUCAN) tablet 100 mg     100 mg Oral Daily 04/16/19 0759 04/19/19 0959   04/15/19 2200  remdesivir 100 mg in sodium chloride 0.9 % 250 mL IVPB  Status:  Discontinued     100 mg 500 mL/hr over 30 Minutes Intravenous Every 24 hours 04/15/19 0103 04/15/19 0110   04/15/19 0115  remdesivir 200 mg in sodium chloride 0.9 % 250 mL IVPB  Status:  Discontinued     200 mg 500 mL/hr over 30 Minutes Intravenous Once 04/15/19 0100 04/15/19 0103   04/15/19 0115  remdesivir 200 mg in sodium chloride 0.9 % 250 mL IVPB  Status:  Discontinued     200 mg 500 mL/hr over 30 Minutes Intravenous Once 04/15/19 0103 04/15/19 0110    04/15/19 0115  remdesivir 200 mg in sodium chloride 0.9 % 250 mL IVPB     200 mg 500 mL/hr over 30 Minutes Intravenous Once 04/15/19 0110 04/15/19 0356      DVT Prophylaxis  :  Lovenox   Inpatient Medications  Scheduled Meds:  dexamethasone  6 mg Oral QHS   enoxaparin (LOVENOX) injection  40 mg Subcutaneous QHS   feeding supplement (ENSURE ENLIVE)  237 mL Oral BID BM   fluconazole  100 mg Oral Daily   gabapentin  100 mg Oral TID   insulin aspart  0-20 Units Subcutaneous TID WC   insulin aspart  0-5 Units Subcutaneous QHS   insulin aspart  6 Units Subcutaneous TID WC   insulin detemir  46 Units Subcutaneous QHS   sertraline  100 mg Oral Daily   Continuous Infusions:  lactated ringers 100 mL/hr at 04/17/19 1020   remdesivir 100 mg in NS 250 mL 100 mg (04/17/19 0842)   PRN Meds:.acetaminophen **OR** acetaminophen, cyclobenzaprine, guaiFENesin-dextromethorphan, ondansetron **OR** ondansetron (ZOFRAN) IV, traMADol   Time Spent in minutes  25   Susa Raring M.D on 04/17/2019 at 10:29 AM  To page go to [email or web address] - use universal password  [facility] -  Office  [phone]    Objective:   Vitals:   04/16/19 2008 04/17/19 0112 04/17/19 0602 04/17/19 0756  BP: 114/77 104/73 112/83 (!) 114/48  Pulse: 85 83 73 84  Resp: Temp:  98.2 F (36.8 C) 97.9 F (36.6 C) 98.1 F (36.7 C)  TempSrc:  Oral Oral Oral  SpO2: 100% 99% 98% 100%  Weight:      Height:  Wt Readings from Last 3 Encounters:  04/15/19 67.7 kg  07/15/16 62.7 kg  06/08/16 62.6 kg    No intake or output data in the 24 hours ending 04/17/19 1029   Physical Exam  Awake Alert, Oriented X 3, No new F.N deficits, Normal affect [place].AT,PERRAL Supple Neck,No JVD, No cervical lymphadenopathy appriciated.  Symmetrical Chest wall movement, Good air movement bilaterally, CTAB RRR,No Gallops, Rubs or new Murmurs, No Parasternal Heave +ve B.Sounds, Abd Soft, No tenderness,  No organomegaly appriciated, No rebound - guarding or rigidity. No Cyanosis, Clubbing or edema, No new Rash or bruise    Data Review:    CBC Recent Labs  Lab 04/14/19 1951 04/15/19 0755 04/16/19 0107 04/17/19 0058  WBC 7.5 8.3 7.8 7.0  HGB 14.6 13.6 12.8 13.5  HCT 42.0 38.0 35.5* 37.8  PLT 334 306 281 263  MCV 85.7 85.0 83.5 84.6  MCH 29.8 30.4 30.1 30.2  MCHC 34.8 35.8 36.1* 35.7  RDW 12.5 12.5 12.5 13.0  LYMPHSABS 1.4  --   --   --   MONOABS 0.7  --   --   --   EOSABS 0.2  --   --   --   BASOSABS 0.0  --   --   --     Chemistries  Recent Labs  Lab 04/14/19 1951 04/15/19 0755 04/16/19 0107 04/17/19 0058  NA 135 136 136 134*  K 3.9 3.6 3.3* 3.2*  CL 106 109 109 102  CO2 12* 11* 20* 23  GLUCOSE 384* 244* 305* 260*  BUN 26*  CREATININE 0.78 0.68 0.52 0.53  CALCIUM 9.0 8.2* 8.5* 8.4*  AST 11* 13* 12* 14*  ALT ALKPHOS 87 76 70 72  BILITOT 1.7* 1.4* 0.7 0.5   ------------------------------------------------------------------------------------------------------------------ No results for input(s): CHOL, HDL, LDLCALC, TRIG, CHOLHDL, LDLDIRECT in the last 72 hours.  Lab Results  Component Value Date   HGBA1C 14.3 (H) 04/15/2019   ------------------------------------------------------------------------------------------------------------------ No results for input(s): TSH, T4TOTAL, T3FREE, THYROIDAB in the last 72 hours.  Invalid input(s): FREET3 ------------------------------------------------------------------------------------------------------------------ Recent Labs    04/16/19 0107 04/17/19 0058  FERRITIN 128 131    Coagulation profile No results for input(s): INR, PROTIME in the last 168 hours.  Recent Labs    04/16/19 0107 04/17/19 0058  DDIMER 1.19* 0.96*    Cardiac Enzymes No results for input(s): CKMB, TROPONINI, MYOGLOBIN in the last 168 hours.  Invalid input(s):  CK ------------------------------------------------------------------------------------------------------------------ No results found for: BNP  Micro Results Recent Results (from the past 240 hour(s))  SARS Coronavirus 2 [facility] order, Performed in [facility] hospital lab) Nasopharyngeal Nasopharyngeal Swab     Status: Abnormal   Collection Time: 04/08/19  8:15 AM   Specimen: Nasopharyngeal Swab  Result Value Ref Range Status   SARS Coronavirus 2 POSITIVE (A) NEGATIVE Final    Comment: RESULT CALLED TO, READ BACK BY AND VERIFIED WITH: HARTLEY A RN [id number] PHILLIPS C (NOTE) If result is NEGATIVE SARS-CoV-2 target nucleic acids are NOT DETECTED. The SARS-CoV-2 RNA is generally detectable in upper and lower  respiratory specimens during the acute phase of infection. The lowest  concentration of SARS-CoV-2 viral copies this assay can detect is 250  copies / mL. A negative result does not preclude SARS-CoV-2 infection  and should not be used as the sole basis for treatment or other  patient management decisions.  A negative result may occur with  improper specimen collection / handling, submission of  specimen other  than nasopharyngeal swab, presence of viral mutation(s) within the  areas targeted by this assay, and inadequate number of viral copies  (<250 copies / mL). A negative result must be combined with clinical  observations, patient history, and epidemiological information. If result is POSITIVE SARS-CoV-2 target nucleic acids are DETECTED. T he SARS-CoV-2 RNA is generally detectable in upper and lower  respiratory specimens during the acute phase of infection.  Positive  results are indicative of active infection with SARS-CoV-2.  Clinical  correlation with patient history and other diagnostic information is  necessary to determine patient infection status.  Positive results do  not rule out bacterial infection or co-infection with other viruses. If result is  PRESUMPTIVE POSTIVE SARS-CoV-2 nucleic acids MAY BE PRESENT.   A presumptive positive result was obtained on the submitted specimen  and confirmed on repeat testing.  While 2019 novel coronavirus  (SARS-CoV-2) nucleic acids may be present in the submitted sample  additional confirmatory testing may be necessary for epidemiological  and / or clinical management purposes  to differentiate between  SARS-CoV-2 and other Sarbecovirus currently known to infect humans.  If clinically indicated additional testing with an alternate test  methodology [phone]) is  advised. The SARS-CoV-2 RNA is generally  detectable in upper and lower respiratory specimens during the acute  phase of infection. The expected result is Negative. Fact Sheet for Patients:  BoilerBrush.com.cy Fact Sheet for Healthcare Providers: https://pope.com/ This test is not yet approved or cleared by the [place] FDA and has been authorized for detection and/or diagnosis of SARS-CoV-2 by FDA under an Emergency Use Authorization (EUA).  This EUA will remain in effect (meaning this test can be used) for the duration of the COVID-19 declaration under Section 564(b)(1) of the Act, 21 U.S.C. section 360bbb-3(b)(1), unless the authorization is terminated or revoked sooner. Performed at [facility], [address] [place], [place] [address]   MRSA PCR Screening     Status: None   Collection Time: 04/15/19  5:51 AM   Specimen: Nasopharyngeal  Result Value Ref Range Status   MRSA by PCR NEGATIVE NEGATIVE Final    Comment:        The GeneXpert MRSA Assay (FDA approved for NASAL specimens only), is one component of a comprehensive MRSA colonization surveillance program. It is not intended to diagnose MRSA infection nor to guide or monitor treatment for MRSA infections. Performed at [facility], [address] [place], [place] [address]      Radiology Reports Dg Chest Port 1 View  Result Date: 04/14/2019 CLINICAL DATA:  Pt arrived via [facility] from home with CC near syncople episode. Pt denies injury or pain but endorses generalized weakness. Hx of Type 1 Diabetes. Pt last ate 0900 oatmeal and has not taken any insulin today. Per EMS Afebrile CBG 441 VSS. Pt is COVID POS tested 9/18 [facility] EXAM: PORTABLE CHEST 1 VIEW COMPARISON:  04/08/2019 FINDINGS: Subtle areas of hazy airspace opacity noted on the prior radiographs have improved. There are mostly resolved. No new lung abnormalities.  No pleural effusion or pneumothorax. Heart, mediastinum hila are within normal limits. Skeletal structures are unremarkable. IMPRESSION: 1. Improved areas of multifocal lung opacity when compared to the prior chest radiograph. 2. No new abnormalities. Electronically Signed   By: Amie Portland M.D.   On: 04/14/2019 19:54   Dg Chest Portable 1 View  Result Date: 04/08/2019 CLINICAL DATA:  Cough and shortness of breath  for 1 week. Loss of taste. EXAM: PORTABLE CHEST 1 VIEW COMPARISON:  None. FINDINGS: The heart size and mediastinal contours are within normal limits. Mild airspace disease is seen in the left lower lung and right midlung suspicious for multifocal pneumonia, possibly viral pneumonia. No evidence of pleural effusion. IMPRESSION: Mild multifocal airspace disease in left lower lung and right midlung, suspicious for multifocal pneumonia, possibly viral pneumonia. Electronically Signed   By: Danae Orleans M.D.   On: 04/08/2019 08:29   Vas [place] Lower Extremity Venous (dvt)  Result Date: 04/16/2019  Lower Venous Study Indications: Pain, and Covid-19 ++.  Comparison Study: No priors Performing Technologist: Elwood Sink Sturdivant RDMS, RVT  Examination Guidelines: A complete evaluation includes B-mode imaging, spectral Doppler, color Doppler, and power Doppler as needed of all accessible portions of each vessel. Bilateral testing is considered an  integral part of a complete examination. Limited examinations for reoccurring indications may be performed as noted.  +---------+---------------+---------+-----------+----------+--------------+  RIGHT     Compressibility Phasicity Spontaneity Properties Thrombus Aging  +---------+---------------+---------+-----------+----------+--------------+  CFV       Full            Yes       Yes                                    +---------+---------------+---------+-----------+----------+--------------+  SFJ       Full                                                             +---------+---------------+---------+-----------+----------+--------------+  FV Prox   Full                                                             +---------+---------------+---------+-----------+----------+--------------+  FV Mid    Full                                                             +---------+---------------+---------+-----------+----------+--------------+  FV Distal Full                                                             +---------+---------------+---------+-----------+----------+--------------+  PFV       Full                                                             +---------+---------------+---------+-----------+----------+--------------+  POP       Full  Yes       Yes                                    +---------+---------------+---------+-----------+----------+--------------+  PTV       Full                                                             +---------+---------------+---------+-----------+----------+--------------+  PERO      Full                                                             +---------+---------------+---------+-----------+----------+--------------+   +---------+---------------+---------+-----------+----------+--------------+  LEFT      Compressibility Phasicity Spontaneity Properties Thrombus Aging  +---------+---------------+---------+-----------+----------+--------------+  CFV        Full            Yes       Yes                                    +---------+---------------+---------+-----------+----------+--------------+  SFJ       Full                                                             +---------+---------------+---------+-----------+----------+--------------+  FV Prox   Full                                                             +---------+---------------+---------+-----------+----------+--------------+  FV Mid    Full                                                             +---------+---------------+---------+-----------+----------+--------------+  FV Distal Full                                                             +---------+---------------+---------+-----------+----------+--------------+  PFV       Full                                                             +---------+---------------+---------+-----------+----------+--------------+  POP       Full            Yes       Yes                                    +---------+---------------+---------+-----------+----------+--------------+  PTV       Full                                                             +---------+---------------+---------+-----------+----------+--------------+  PERO      Full                                                             +---------+---------------+---------+-----------+----------+--------------+     Summary: Right: There is no evidence of deep vein thrombosis in the lower extremity. No cystic structure found in the popliteal fossa. Left: There is no evidence of deep vein thrombosis in the lower extremity. No cystic structure found in the popliteal fossa.  *See table(s) above for measurements and observations. Electronically signed by Gretta Beganodd Early MD on 04/16/2019 at 3:58:47 AM.    Final

## 2019-04-18 LAB — GLUCOSE, CAPILLARY
Glucose-Capillary: 228 mg/dL — ABNORMAL HIGH (ref 70–99)
Glucose-Capillary: 247 mg/dL — ABNORMAL HIGH (ref 70–99)
Glucose-Capillary: 186 mg/dL — ABNORMAL HIGH (ref 70–99)
Glucose-Capillary: 211 mg/dL — ABNORMAL HIGH (ref 70–99)
Glucose-Capillary: 215 mg/dL — ABNORMAL HIGH (ref 70–99)
Glucose-Capillary: 218 mg/dL — ABNORMAL HIGH (ref 70–99)
Glucose-Capillary: 225 mg/dL — ABNORMAL HIGH (ref 70–99)
Glucose-Capillary: 254 mg/dL — ABNORMAL HIGH (ref 70–99)
Glucose-Capillary: 280 mg/dL — ABNORMAL HIGH (ref 70–99)
Glucose-Capillary: 309 mg/dL — ABNORMAL HIGH (ref 70–99)
Glucose-Capillary: 320 mg/dL — ABNORMAL HIGH (ref 70–99)
Glucose-Capillary: 78 mg/dL (ref 70–99)

## 2019-04-18 LAB — BASIC METABOLIC PANEL
Anion gap: 11 (ref 5–15)
BUN: 21 mg/dL — ABNORMAL HIGH (ref 6–20)
CO2: 25 mmol/L (ref 22–32)
Calcium: 8.4 mg/dL — ABNORMAL LOW (ref 8.9–10.3)
Chloride: 103 mmol/L (ref 98–111)
Creatinine, Ser: 0.41 mg/dL — ABNORMAL LOW (ref 0.44–1.00)
GFR calc Af Amer: >60 mL/min (ref >60)
GFR calc non Af Amer: >60 mL/min (ref >60)
Glucose, Bld: 229 mg/dL — ABNORMAL HIGH (ref 70–99)
Potassium: 4 mmol/L (ref 3.5–5.1)
Sodium: 139 mmol/L (ref 135–145)

## 2019-04-18 LAB — MAGNESIUM: Magnesium: 2.1 mg/dL (ref 1.7–2.4)

## 2019-04-18 MED ORDER — MORPHINE SULFATE (PF) 2 MG/ML IV SOLN
4.0000 mg | INTRAVENOUS | Status: DC | PRN
  Administered 2019-04-18: 4 mg via INTRAVENOUS
  Filled 2019-04-18: qty 2

## 2019-04-18 MED ORDER — DEXAMETHASONE 4 MG PO TABS
2.0000 mg | ORAL_TABLET | Freq: Every day | ORAL | Status: DC
  Administered 2019-04-18 – 2019-04-19 (×2): 2 mg via ORAL
  Filled 2019-04-18 (×2): qty 1

## 2019-04-18 MED ORDER — MIDODRINE HCL 5 MG PO TABS
5.0000 mg | ORAL_TABLET | Freq: Three times a day (TID) | ORAL | Status: DC
  Administered 2019-04-18: 5 mg via ORAL
  Filled 2019-04-18: qty 1

## 2019-04-18 MED ORDER — LACTATED RINGERS IV SOLN
INTRAVENOUS | Status: DC
  Administered 2019-04-18 (×2): via INTRAVENOUS

## 2019-04-18 NOTE — Progress Notes (Signed)
PROGRESS NOTE                                                                                                                                                                                                             [name] Demographics:    [name], is a 48 y.o. female, DOB - 1971/04/01, WUJ:[id number]  Outpatient Primary MD for the [name] is [name], No Pcp Per   Admit date - 04/14/2019   LOS - 4  Chief Complaint  [name] presents with   Weakness    COVID POSITIVE       Brief Narrative: [name] is a 48 y.o. female with PMHx of insulin-dependent DM, depression- diagnosed with COVID-19 on 9/18-presented with worsening exertional dyspnea, and a syncopal episode.  She was subsequently admitted to the hospitalist service.  See below for further details   Subjective:   [name] sitting in chair appears to be extremely comfortable, visibly anxious, has questions about going home alone, denies any headache chest pain but stays she is still orthostatic when she stands up and is concerned about it.   Assessment  & Plan :   Covid 19 Viral pneumonia and gastroenteritis: Currently on steroids and Remdisvir, no oxygen need, remains stable on room air.  Continue supportive care.  Inflammatory markers are stable.  Advance activity and monitor.  GI symptoms completely resolved.  COVID-19 Labs:  Recent Labs    04/16/19 0107 04/17/19 0058  DDIMER 1.19* 0.96*  FERRITIN 128 131  CRP 1.5* 1.0*    Lab Results  Component Value Date   SARSCOV2NAA POSITIVE (A) 04/08/2019     COVID-19 Medications: Steroids: 9/24>> Remdesivir: 9/24>> Actemra: Not indicated Convalescent Plasma: Not indicated Research Studies:N/A   Syncope: Secondary to orthostatic hypotension-caused by dehydration due to diarrhea also likely has autonomic instability for extremely poorly controlled DM type II with A1c of 14.3.  She had mild COVID-19  induced gastroenteritis which has resolved.  She is still slightly orthostatic dropping about 10-12 points upon standing up, will give her another liter and a half of IV fluids on 04/18/2019, TED stockings up to thigh-high and monitor, she appears to be completely asymptomatic although anxious.  Hypokalemia.  Replaced.    Insulin-dependent DM with uncontrolled hyperglycemia (A1c 14.3 on 04/15/2019): Extremely poorly controlled outpatient due to hyperglycemia A1c of 14.3, currently on Tresiba and  sliding scale, pre-meal NovoLog increased for better control.  Steroids being tapered will monitor closely.  CBG (last 3)  Recent Labs    04/15/19 2005 04/16/19 0832 04/16/19 1143  GLUCAP 274* 275* 272*   Lab Results  Component Value Date   HGBA1C 14.3 (H) 04/15/2019    Depression: Stable-continue Zoloft  Candida vaginitis:   Diflucan x3 days.   Condition -Stable  Family Communication  : [name] told me that her brother is a cardio thoracic/neurosurgeon.  I offered to talk to him but she declined to give me the number.  Code Status :  Full Code  Diet :  Diet Order            Diet Carb Modified Fluid consistency: Thin; Room service appropriate? Yes  Diet effective now               Disposition Plan  : Possible discharge in the morning.  Barriers to discharge: Orthostatic hypotension   Consults  :  None  Procedures  :  None  Antibiotics  :    Anti-infectives (From admission, onward)   Start     Dose/Rate Route Frequency Ordered Stop   04/16/19 1000  remdesivir 100 mg in sodium chloride 0.9 % 250 mL IVPB     100 mg 500 mL/hr over 30 Minutes Intravenous Daily 04/15/19 0110 04/20/19 0959   04/16/19 1000  fluconazole (DIFLUCAN) tablet 100 mg     100 mg Oral Daily 04/16/19 0759 04/18/19 0851   04/15/19 2200  remdesivir 100 mg in sodium chloride 0.9 % 250 mL IVPB  Status:  Discontinued     100 mg 500 mL/hr over 30 Minutes Intravenous Every 24 hours 04/15/19 0103 04/15/19 0110     04/15/19 0115  remdesivir 200 mg in sodium chloride 0.9 % 250 mL IVPB  Status:  Discontinued     200 mg 500 mL/hr over 30 Minutes Intravenous Once 04/15/19 0100 04/15/19 0103   04/15/19 0115  remdesivir 200 mg in sodium chloride 0.9 % 250 mL IVPB  Status:  Discontinued     200 mg 500 mL/hr over 30 Minutes Intravenous Once 04/15/19 0103 04/15/19 0110   04/15/19 0115  remdesivir 200 mg in sodium chloride 0.9 % 250 mL IVPB     200 mg 500 mL/hr over 30 Minutes Intravenous Once 04/15/19 0110 04/15/19 0356      DVT Prophylaxis  :  Lovenox   Inpatient Medications  Scheduled Meds:  dexamethasone  2 mg Oral QHS   enoxaparin (LOVENOX) injection  40 mg Subcutaneous QHS   feeding supplement (ENSURE ENLIVE)  237 mL Oral BID BM   gabapentin  100 mg Oral TID   insulin aspart  0-20 Units Subcutaneous TID WC   insulin aspart  0-5 Units Subcutaneous QHS   insulin aspart  8 Units Subcutaneous TID WC   insulin detemir  46 Units Subcutaneous QHS   sertraline  100 mg Oral Daily   Continuous Infusions:  lactated ringers     remdesivir 100 mg in NS 250 mL 100 mg (04/18/19 0900)   PRN Meds:.acetaminophen **OR** acetaminophen, cyclobenzaprine, guaiFENesin-dextromethorphan, [DISCONTINUED] ondansetron **OR** ondansetron (ZOFRAN) IV, traMADol   Time Spent in minutes  25   Lala Lund M.D on 04/18/2019 at 9:11 AM  To page go to [email or web address] - use universal password  [facility] -  Office  [phone]    Objective:   Vitals:   04/17/19 1541 04/17/19 1952 04/18/19 0337 04/18/19 0700  BP: 128/86 123/81 Marland Kitchen)  130/94 100/79  Pulse: 82 91 87 84  Resp: 16 (!) 25 20 15   Temp: 98 F (36.7 C) 98.6 F (37 C) 97.8 F (36.6 C) 97.9 F (36.6 C)  TempSrc: Oral Oral Oral Oral  SpO2: 99% 98% 100% 97%  Weight:      Height:        Wt Readings from Last 3 Encounters:  04/15/19 67.7 kg  07/15/16 62.7 kg  06/08/16 62.6 kg     Intake/Output Summary (Last 24 hours) at 04/18/2019  0911 Last data filed at 04/18/2019 0500 Gross per 24 hour  Intake 1140 ml  Output 650 ml  Net 490 ml     Physical Exam  Awake Alert, Oriented X 3, No new F.N deficits, anxious affect [place].AT,PERRAL Supple Neck,No JVD, No cervical lymphadenopathy appriciated.  Symmetrical Chest wall movement, Good air movement bilaterally, CTAB RRR,No Gallops, Rubs or new Murmurs, No Parasternal Heave +ve B.Sounds, Abd Soft, No tenderness, No organomegaly appriciated, No rebound - guarding or rigidity. No Cyanosis, Clubbing or edema, No new Rash or bruise     Data Review:    CBC Recent Labs  Lab 04/14/19 1951 04/15/19 0755 04/16/19 0107 04/17/19 0058  WBC 7.5 8.3 7.8 7.0  HGB 14.6 13.6 12.8 13.5  HCT 42.0 38.0 35.5* 37.8  PLT 334 306 281 263  MCV 85.7 85.0 83.5 84.6  MCH 29.8 30.4 30.1 30.2  MCHC 34.8 35.8 36.1* 35.7  RDW 12.5 12.5 12.5 13.0  LYMPHSABS 1.4  --   --   --   MONOABS 0.7  --   --   --   EOSABS 0.2  --   --   --   BASOSABS 0.0  --   --   --     Chemistries  Recent Labs  Lab 04/14/19 1951 04/15/19 0755 04/16/19 0107 04/17/19 0058 04/18/19 0410  NA 135 136 136 134* 139  K 3.9 3.6 3.3* 3.2* 4.0  CL 106 109 109 102 103  CO2 12* 11* 20* 23 25  GLUCOSE 384* 244* 305* 260* 229*  BUN 17 15 18  26* 21*  CREATININE 0.78 0.68 0.52 0.53 0.41*  CALCIUM 9.0 8.2* 8.5* 8.4* 8.4*  MG  --   --   --   --  2.1  AST 11* 13* 12* 14*  --   ALT 12 13 9 12   --   ALKPHOS 87 76 70 72  --   BILITOT 1.7* 1.4* 0.7 0.5  --    ------------------------------------------------------------------------------------------------------------------ No results for input(s): CHOL, HDL, LDLCALC, TRIG, CHOLHDL, LDLDIRECT in the last 72 hours.  Lab Results  Component Value Date   HGBA1C 14.3 (H) 04/15/2019   ------------------------------------------------------------------------------------------------------------------ No results for input(s): TSH, T4TOTAL, T3FREE, THYROIDAB in the last 72  hours.  Invalid input(s): FREET3 ------------------------------------------------------------------------------------------------------------------ Recent Labs    04/16/19 0107 04/17/19 0058  FERRITIN 128 131    Coagulation profile No results for input(s): INR, PROTIME in the last 168 hours.  Recent Labs    04/16/19 0107 04/17/19 0058  DDIMER 1.19* 0.96*    Cardiac Enzymes No results for input(s): CKMB, TROPONINI, MYOGLOBIN in the last 168 hours.  Invalid input(s): CK ------------------------------------------------------------------------------------------------------------------ No results found for: BNP  Micro Results Recent Results (from the past 240 hour(s))  MRSA PCR Screening     Status: None   Collection Time: 04/15/19  5:51 AM   Specimen: Nasopharyngeal  Result Value Ref Range Status   MRSA by PCR NEGATIVE NEGATIVE Final    Comment:  The GeneXpert MRSA Assay (FDA approved for NASAL specimens only), is one component of a comprehensive MRSA colonization surveillance program. It is not intended to diagnose MRSA infection nor to guide or monitor treatment for MRSA infections. Performed at [facility], [address] [place], [place] [address]     Radiology Reports Dg Chest Port 1 View  Result Date: 04/14/2019 CLINICAL DATA:  Pt arrived via [facility] from home with CC near syncople episode. Pt denies injury or pain but endorses generalized weakness. Hx of Type 1 Diabetes. Pt last ate 0900 oatmeal and has not taken any insulin today. Per EMS Afebrile CBG 441 VSS. Pt is COVID POS tested 9/18 [facility] EXAM: PORTABLE CHEST 1 VIEW COMPARISON:  04/08/2019 FINDINGS: Subtle areas of hazy airspace opacity noted on the prior radiographs have improved. There are mostly resolved. No new lung abnormalities.  No pleural effusion or pneumothorax. Heart, mediastinum hila are within normal limits. Skeletal structures are unremarkable.  IMPRESSION: 1. Improved areas of multifocal lung opacity when compared to the prior chest radiograph. 2. No new abnormalities. Electronically Signed   By: Amie Portlandavid  Ormond M.D.   On: 04/14/2019 19:54   Dg Chest Portable 1 View  Result Date: 04/08/2019 CLINICAL DATA:  Cough and shortness of breath for 1 week. Loss of taste. EXAM: PORTABLE CHEST 1 VIEW COMPARISON:  None. FINDINGS: The heart size and mediastinal contours are within normal limits. Mild airspace disease is seen in the left lower lung and right midlung suspicious for multifocal pneumonia, possibly viral pneumonia. No evidence of pleural effusion. IMPRESSION: Mild multifocal airspace disease in left lower lung and right midlung, suspicious for multifocal pneumonia, possibly viral pneumonia. Electronically Signed   By: Danae OrleansJohn A Stahl M.D.   On: 04/08/2019 08:29   Vas [place] Lower Extremity Venous (dvt)  Result Date: 04/16/2019  Lower Venous Study Indications: Pain, and Covid-19 ++.  Comparison Study: No priors Performing Technologist: Gans Sinkita Sturdivant RDMS, RVT  Examination Guidelines: A complete evaluation includes B-mode imaging, spectral Doppler, color Doppler, and power Doppler as needed of all accessible portions of each vessel. Bilateral testing is considered an integral part of a complete examination. Limited examinations for reoccurring indications may be performed as noted.  +---------+---------------+---------+-----------+----------+--------------+  RIGHT     Compressibility Phasicity Spontaneity Properties Thrombus Aging  +---------+---------------+---------+-----------+----------+--------------+  CFV       Full            Yes       Yes                                    +---------+---------------+---------+-----------+----------+--------------+  SFJ       Full                                                             +---------+---------------+---------+-----------+----------+--------------+  FV Prox   Full                                                              +---------+---------------+---------+-----------+----------+--------------+  FV Mid    Full                                                             +---------+---------------+---------+-----------+----------+--------------+  FV Distal Full                                                             +---------+---------------+---------+-----------+----------+--------------+  PFV       Full                                                             +---------+---------------+---------+-----------+----------+--------------+  POP       Full            Yes       Yes                                    +---------+---------------+---------+-----------+----------+--------------+  PTV       Full                                                             +---------+---------------+---------+-----------+----------+--------------+  PERO      Full                                                             +---------+---------------+---------+-----------+----------+--------------+   +---------+---------------+---------+-----------+----------+--------------+  LEFT      Compressibility Phasicity Spontaneity Properties Thrombus Aging  +---------+---------------+---------+-----------+----------+--------------+  CFV       Full            Yes       Yes                                    +---------+---------------+---------+-----------+----------+--------------+  SFJ       Full                                                             +---------+---------------+---------+-----------+----------+--------------+  FV Prox   Full                                                             +---------+---------------+---------+-----------+----------+--------------+  FV Mid    Full                                                             +---------+---------------+---------+-----------+----------+--------------+  FV Distal Full                                                              +---------+---------------+---------+-----------+----------+--------------+  PFV       Full                                                             +---------+---------------+---------+-----------+----------+--------------+  POP       Full            Yes       Yes                                    +---------+---------------+---------+-----------+----------+--------------+  PTV       Full                                                             +---------+---------------+---------+-----------+----------+--------------+  PERO      Full                                                             +---------+---------------+---------+-----------+----------+--------------+     Summary: Right: There is no evidence of deep vein thrombosis in the lower extremity. No cystic structure found in the popliteal fossa. Left: There is no evidence of deep vein thrombosis in the lower extremity. No cystic structure found in the popliteal fossa.  *See table(s) above for measurements and observations. Electronically signed by Gretta Began MD on 04/16/2019 at 3:58:47 AM.    Final

## 2019-04-18 NOTE — Evaluation (Addendum)
Physical Therapy Evaluation Patient Details Name: [name] MRN: [id number] DOB: 20-Nov-1970 Today's Date: 04/18/2019   History of Present Illness  48 y.o. female admitted on 04/14/19 for SOB and LOC/syncope.  Pt dx with COVID 19 viral PNA and gastroenteritis, orthostatic hypotensionwith autonomic instability (thought to be due to poorly controlled DM).  Pt with significant PMH of diabetes, depression, patellar release and manipulation.   Clinical Impression  Pt remains mildly orthostatic this PM with thigh high TED hose donned.  She was able to do short bits of activity before sitting down, but is worried about going home alone.  We discussed safety strategies at length as well as having a home BP monitor, 4 wheeled RW with seat so that she can sit at moment's notice, adding an abdominal binder to help increase her pressure along with TED hose, preforming ankle pumps as she starts to feel lightheaded and preforming her activities in short boughs.  I would like to try her on the rollator tomorrow taking serial BPs as needed with symptoms.  I am also recommending HHPT to safely monitor activity progression.   PT to follow acutely for deficits listed below.     04/18/19 1732  Orthostatic Lying   BP- Lying 123/79  Pulse- Lying 90  Orthostatic Sitting  BP- Sitting 116/71  Pulse- Sitting 91  Orthostatic Standing at 0 minutes  BP- Standing at 0 minutes 102/54  Pulse- Standing at 0 minutes 99  Orthostatic Standing at 3 minutes  BP- Standing at 3 minutes 108/53  Pulse- Standing at 3 minutes 101       Follow Up Recommendations Home health PT    Equipment Recommendations  Other (comment)(4 wheeled RW, home BP monitor)    Recommendations for Other Services   NA    Precautions / Restrictions Precautions Precautions: Fall Precaution Comments: (+) orthostatic hypotension`      Mobility  Bed Mobility Overal bed mobility: Independent                Transfers Overall transfer  level: Needs assistance Equipment used: None Transfers: Sit to/from Stand Sit to Stand: Supervision         General transfer comment: close supervision for safety  Ambulation/Gait Ambulation/Gait assistance: Supervision Gait Distance (Feet): 15 Feet Assistive device: IV Pole Gait Pattern/deviations: WFL(Within Functional Limits)     General Gait Details: close supervision for safety, short distance due to lightheadedness.         Balance Overall balance assessment: Needs assistance Sitting-balance support: Feet supported;No upper extremity supported Sitting balance-Leahy Scale: Normal     Standing balance support: Single extremity supported;No upper extremity supported Standing balance-Leahy Scale: Fair Standing balance comment: close supervision in standing.  The longer we stand EOB the more tremulous she gets. She was able to stand to get the 3 min BP.                              Pertinent Vitals/Pain Pain Assessment: Faces Faces Pain Scale: Hurts little more Pain Location: head  Pain Descriptors / Indicators: Aching Pain Intervention(s): Limited activity within patient's tolerance;Monitored during session;Repositioned    Home Living Family/patient expects to be discharged to:: Private residence Living Arrangements: Children(47 y.o. 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 [employer] Assessment   Upper Extremity Assessment Upper Extremity Assessment: Overall WFL for tasks assessed    Lower Extremity Assessment Lower Extremity Assessment: Overall WFL for tasks  assessed    Cervical / Trunk Assessment Cervical / Trunk Assessment: Normal  Communication   Communication: No difficulties  Cognition Arousal/Alertness: Awake/alert Behavior During Therapy: WFL for tasks assessed/performed Overall Cognitive Status: Within Functional Limits for tasks assessed                                        General Comments General comments (skin integrity, edema, etc.): We spoke at length re: safety at home.  She is fearful to be by herself given she passed out at home.  We discussed things that would help make her safer: sitting to bathe, sitting to dress, sitting to brush her teeth.  Having a walker with a seat on it so she can sit anytime she starts to feel lightheaded and faint.  It would be helpful to have a BP monitor at home.      Exercises Other Exercises Other Exercises: encouraged ankle pumps when lightheaded and with every commercial break when in bed.     Assessment/Plan    PT Assessment Patient needs continued PT services  PT Problem List Decreased activity tolerance;Decreased knowledge of use of DME;Decreased knowledge of precautions       PT Treatment Interventions DME instruction;Gait training;Functional mobility training;Therapeutic activities;Therapeutic exercise;Patient/family education    PT Goals (Current goals can be found in the Care Plan section)  Acute Rehab PT Goals Patient Stated Goal: to feel safe going home.  PT Goal Formulation: With patient Time For Goal Achievement: 05/02/19 Potential to Achieve Goals: Good    Frequency Min 3X/week           AM-PAC PT "6 Clicks" Mobility  Outcome Measure Help needed turning from your back to your side while in a flat bed without using bedrails?: None Help needed moving from lying on your back to sitting on the side of a flat bed without using bedrails?: None Help needed moving to and from a bed to a chair (including a wheelchair)?: None Help needed standing up from  a chair using your arms (e.g., wheelchair or bedside chair)?: None Help needed to walk in hospital room?: None Help needed climbing 3-5 steps with a railing? : A Little 6 Click Score: 23    End of Session   Activity Tolerance: Other (comment)(limited by lightheadedness in standing. ) Patient left: in bed Nurse Communication: Mobility status PT Visit Diagnosis: Difficulty in walking, not elsewhere classified (R26.2)    Time: 2841-3244 PT Time Calculation (min) (ACUTE ONLY): 60 min   Charges:          Lurena Joiner B. Anthon Harpole, PT, DPT  Acute Rehabilitation [phone] pager #[phone] office  @ [facility]: [phone]   PT Evaluation $PT Eval Moderate Complexity: 1 Mod PT Treatments $Therapeutic Activity: 8-22 mins $Self Care/Home Management: 23-37      04/18/2019, 6:39 PM

## 2019-04-18 NOTE — Progress Notes (Addendum)
Inpatient Diabetes Program Recommendations  AACE/ADA: New Consensus Statement on Inpatient Glycemic Control (2015)  Target Ranges:  Prepandial:   less than 140 mg/dL      Peak postprandial:   less than 180 mg/dL (1-2 hours)      Critically ill patients:  140 - 180 mg/dL   Lab Results  Component Value Date   GLUCAP 272 (H) 04/16/2019   HGBA1C 14.3 (H) 04/15/2019    Review of Glycemic Control Results for MARLY, SCHULD (MRN [id number]) as of 04/18/2019 10:39  Ref. Range 04/17/2019 00:58 04/18/2019 04:10  Glucose Latest Ref Range: 70 - 99 mg/dL 260 (H) 229 (H)    Diabetes history: Type 1 DM Outpatient Diabetes medications: Novolog 1:6 grams, 1 units for every 30 pts > 130 mg/dL, Tresiba 36 units QHS Current orders for Inpatient glycemic control: Lantus 42 units QHS, Novolog 8 units TID, Novolog 0-9 units TID Decadron 2 mg QHS  Inpatient Diabetes Program Recommendations Consider increasing Levemir to 50 units QHS.   Verifed with RN CBGs not crossing over, not using CGM. RN to attempt to correct docking meter.    Thanks, Bronson Curb, MSN, RNC-OB Diabetes Coordinator [phone] (8a-5p)

## 2019-04-18 NOTE — Progress Notes (Signed)
Pt ambulates in room without assistance c/o dizziness

## 2019-04-18 NOTE — Progress Notes (Signed)
Pt. constant chest heaviness in her chest when breathing. v/s 113/76, 100% , 14,  92 and is normal sinus on monitor.  MD page

## 2019-04-18 NOTE — Progress Notes (Signed)
Pt called out stated she "felt low and requested CBG to be checked. CBG 78. Pt states that is  low for her. RN provided 4oz OJ and crackers. Held 1700 insulin. Notified Diabetic coordinator of recent CBG

## 2019-04-19 DIAGNOSIS — J9601 Acute respiratory failure with hypoxia: Secondary | ICD-10-CM

## 2019-04-19 DIAGNOSIS — U071 COVID-19: Principal | ICD-10-CM

## 2019-04-19 LAB — COMPREHENSIVE METABOLIC PANEL
ALT: 25 U/L (ref 0–44)
AST: 31 U/L (ref 15–41)
Albumin: 3.1 g/dL — ABNORMAL LOW (ref 3.5–5.0)
Alkaline Phosphatase: 66 U/L (ref 38–126)
Anion gap: 9 (ref 5–15)
BUN: 15 mg/dL (ref 6–20)
CO2: 30 mmol/L (ref 22–32)
Calcium: 8.7 mg/dL — ABNORMAL LOW (ref 8.9–10.3)
Chloride: 101 mmol/L (ref 98–111)
Creatinine, Ser: 0.33 mg/dL — ABNORMAL LOW (ref 0.44–1.00)
GFR calc Af Amer: >60 mL/min (ref >60)
GFR calc non Af Amer: >60 mL/min (ref >60)
Glucose, Bld: 126 mg/dL — ABNORMAL HIGH (ref 70–99)
Potassium: 4.2 mmol/L (ref 3.5–5.1)
Sodium: 140 mmol/L (ref 135–145)
Total Bilirubin: 0.4 mg/dL (ref 0.3–1.2)
Total Protein: 6.1 g/dL — ABNORMAL LOW (ref 6.5–8.1)

## 2019-04-19 LAB — CORTISOL: Cortisol, Plasma: 0.5 ug/dL

## 2019-04-19 LAB — GLUCOSE, CAPILLARY
Glucose-Capillary: 353 mg/dL — ABNORMAL HIGH (ref 70–99)
Glucose-Capillary: 376 mg/dL — ABNORMAL HIGH (ref 70–99)
Glucose-Capillary: 99 mg/dL (ref 70–99)
Glucose-Capillary: 123 mg/dL — ABNORMAL HIGH (ref 70–99)
Glucose-Capillary: 184 mg/dL — ABNORMAL HIGH (ref 70–99)

## 2019-04-19 LAB — TSH: TSH: 1.029 u[IU]/mL (ref 0.350–4.500)

## 2019-04-19 MED ORDER — SODIUM CHLORIDE 0.9 % IV BOLUS
1000.0000 mL | Freq: Once | INTRAVENOUS | Status: AC
  Administered 2019-04-19: 11:00:00 1000 mL via INTRAVENOUS

## 2019-04-19 MED ORDER — MIDODRINE HCL 5 MG PO TABS
5.0000 mg | ORAL_TABLET | Freq: Three times a day (TID) | ORAL | Status: DC
  Administered 2019-04-19 – 2019-04-20 (×4): 5 mg via ORAL
  Filled 2019-04-19 (×4): qty 1

## 2019-04-19 NOTE — Progress Notes (Addendum)
Inpatient Diabetes Program Recommendations  AACE/ADA: New Consensus Statement on Inpatient Glycemic Control  Target Ranges:  Prepandial:   less than 140 mg/dL      Peak postprandial:   less than 180 mg/dL (1-2 hours)      Critically ill patients:  140 - 180 mg/dL   Results for [name], [name] (MRN [id number]) as of 04/19/2019 11:26  Ref. Range 04/18/2019 07:52 04/18/2019 11:06 04/18/2019 15:45 04/18/2019 19:38 04/18/2019 21:13 04/19/2019 07:32  Glucose-Capillary Latest Ref Range: 70 - 99 mg/dL 228 (H)  Novolog 15 units 247 (H)  Novolog 15 units 78 353 (H) 376 (H)  Novolog 5 units  Levemir 46 units 99   Review of Glycemic Control  Diabetes history: DM1 Outpatient Diabetes medications: Tresiba 36 units QHS, Novolog 1 unit for every 6 grams, Novolog 1 unit drops glucose 30 mg/dl  Current orders for Inpatient glycemic control: Levemir 46 units QHS, Novolog 0-20 units TID with meals, Novolog 0-5 units QHS, Novolog 8 units TID with meals; Decadron 2 mg QHS  Inpatient Diabetes Program Recommendations:    Insulin-Meal Coverage: Meal coverage was given for lunch on 04/18/19 despite only eating 25% per chart.  Noted meal coverage was not given with supper on 04/18/19 due to premeal glucose of 78 mg/dl then following glucose up to 353 mg/dl at 19:38. If post prandial glucose continues to be consistently greater than 180 mg/dl and steroids are continued, please consider increasing meal coverage to Novolog 10 units TID with meals if patient eats at least 50% of meals.  Thanks, Barnie Alderman, RN, MSN, CDE Diabetes Coordinator Inpatient Diabetes Program [phone] (Team Pager from 8am to 5pm)

## 2019-04-19 NOTE — Progress Notes (Signed)
PROGRESS NOTE                                                                                                                                                                                                             Patient Demographics:    [name], is a 48 y.o. female, DOB - 10/14/1970, GEZ:[id number]  Outpatient Primary MD for the patient is Patient, No Pcp Per   Admit date - 04/14/2019   LOS - 5  Chief Complaint  Patient presents with   Weakness    COVID POSITIVE       Brief Narrative: Patient is a 48 y.o. female with PMHx of insulin-dependent DM, depression- diagnosed with COVID-19 on 9/18-presented with worsening exertional dyspnea, and a syncopal episode.  She was subsequently admitted to the hospitalist service.  See below for further details   Subjective:   Patient sitting in chair in no distress appears comfortable but anxious, afraid to go home states she is getting extremely anxious, denies any headache chest or abdominal pain.  No dizziness upon standing up.   Assessment  & Plan :   Covid 19 Viral pneumonia and gastroenteritis: Currently on steroids and Remdisvir, no oxygen need, remains stable on room air.  Continue supportive care.  Inflammatory markers are stable.  Advance activity and monitor.  GI symptoms completely resolved.  COVID-19 Labs:  Recent Labs    04/17/19 0058  DDIMER 0.96*  FERRITIN 131  CRP 1.0*    Lab Results  Component Value Date   SARSCOV2NAA POSITIVE (A) 04/08/2019     COVID-19 Medications: Steroids: 9/24>> Remdesivir: 9/24>> Actemra: Not indicated Convalescent Plasma: Not indicated Research Studies:N/A   Syncope: Likely due to combination of underlying autonomic instability due to extremely poorly controlled diabetes mellitus worsened by dehydration from diarrhea, she has now received about 4 L of IV fluid, has been placed on Middaugh drain along with  thigh-high TED stockings.  Although she is mildly orthostatic but asymptomatic from the standpoint, has been ambulated by nurses on 04/18/2019 and 04/19/2019 without any symptoms or assistance.  Plan is to also apply abdominal binder on 04/19/2019 by PT.  Hypokalemia.  Replaced.  Extreme anxiety.  Is very afraid of going home alone, extreme anxiety seems to be the main barrier for discharge, will request psych to provide help.  Medically she is stable  with no active medical issues.  Insulin-dependent DM with uncontrolled hyperglycemia (A1c 14.3 on 04/15/2019): Extremely poorly controlled outpatient due to hyperglycemia A1c of 14.3, currently on Tresiba and sliding scale, pre-meal NovoLog increased for better control.  Steroids being tapered will monitor closely.  She claims that her outpatient control has been poor due to poor follow-up by her outpatient physicians.  New endocrinologist will be recommended upon discharge.  CBG (last 3)  Recent Labs    04/18/19 1938 04/18/19 2113 04/19/19 0732  GLUCAP 353* 376* 99   Lab Results  Component Value Date   HGBA1C 14.3 (H) 04/15/2019    Depression: Stable-continue Zoloft  Candida vaginitis:   Diflucan x3 days.   Condition -Stable  Family Communication  : Patient told me that her brother is a cardio thoracic/neurosurgeon.  I offered to talk to him but she declined to give me the number.  Code Status :  Full Code  Diet :  Diet Order            Diet Carb Modified Fluid consistency: Thin; Room service appropriate? Yes  Diet effective now               Disposition Plan  : Possible discharge in the morning.  Barriers to discharge: Extreme anxiety  Consults  : Psych consulted on 04/19/2019.  Procedures  :  None  Antibiotics  :    Anti-infectives (From admission, onward)   Start     Dose/Rate Route Frequency Ordered Stop   04/16/19 1000  remdesivir 100 mg in sodium chloride 0.9 % 250 mL IVPB     100 mg 500 mL/hr over 30 Minutes  Intravenous Daily 04/15/19 0110 04/19/19 1126   04/16/19 1000  fluconazole (DIFLUCAN) tablet 100 mg     100 mg Oral Daily 04/16/19 0759 04/18/19 0851   04/15/19 2200  remdesivir 100 mg in sodium chloride 0.9 % 250 mL IVPB  Status:  Discontinued     100 mg 500 mL/hr over 30 Minutes Intravenous Every 24 hours 04/15/19 0103 04/15/19 0110   04/15/19 0115  remdesivir 200 mg in sodium chloride 0.9 % 250 mL IVPB  Status:  Discontinued     200 mg 500 mL/hr over 30 Minutes Intravenous Once 04/15/19 0100 04/15/19 0103   04/15/19 0115  remdesivir 200 mg in sodium chloride 0.9 % 250 mL IVPB  Status:  Discontinued     200 mg 500 mL/hr over 30 Minutes Intravenous Once 04/15/19 0103 04/15/19 0110   04/15/19 0115  remdesivir 200 mg in sodium chloride 0.9 % 250 mL IVPB     200 mg 500 mL/hr over 30 Minutes Intravenous Once 04/15/19 0110 04/15/19 0356      DVT Prophylaxis  :  Lovenox   Inpatient Medications  Scheduled Meds:  dexamethasone  2 mg Oral QHS   enoxaparin (LOVENOX) injection  40 mg Subcutaneous QHS   feeding supplement (ENSURE ENLIVE)  237 mL Oral BID BM   gabapentin  100 mg Oral TID   insulin aspart  0-20 Units Subcutaneous TID WC   insulin aspart  0-5 Units Subcutaneous QHS   insulin aspart  8 Units Subcutaneous TID WC   insulin detemir  46 Units Subcutaneous QHS   midodrine  5 mg Oral TID WC   sertraline  100 mg Oral Daily   Continuous Infusions:  PRN Meds:.acetaminophen **OR** acetaminophen, cyclobenzaprine, guaiFENesin-dextromethorphan, [DISCONTINUED] ondansetron **OR** ondansetron (ZOFRAN) IV, traMADol   Time Spent in minutes  25   Susa Raring M.D  on 04/19/2019 at 12:33 PM  To page go to [email or web address] - use universal password  [facility] -  Office  [phone]    Objective:   Vitals:   04/19/19 0835 04/19/19 0836 04/19/19 0837 04/19/19 0900  BP: 125/81 127/77 (!) 118/107 94/61  Pulse: 86 93 95 (!) 103  Resp: 18 (!) 21 (!) 25 19  Temp:        TempSrc:      SpO2: 98% 97% 96% 99%  Weight:      Height:        Wt Readings from Last 3 Encounters:  04/15/19 67.7 kg  07/15/16 62.7 kg  06/08/16 62.6 kg     Intake/Output Summary (Last 24 hours) at 04/19/2019 1233 Last data filed at 04/19/2019 1045 Gross per 24 hour  Intake 2433.94 ml  Output --  Net 2433.94 ml     Physical Exam  Awake Alert, Oriented X 3, No new F.N deficits, extremely anxious affect [place].AT,PERRAL Supple Neck,No JVD, No cervical lymphadenopathy appriciated.  Symmetrical Chest wall movement, Good air movement bilaterally, CTAB RRR,No Gallops, Rubs or new Murmurs, No Parasternal Heave +ve B.Sounds, Abd Soft, No tenderness, No organomegaly appriciated, No rebound - guarding or rigidity. No Cyanosis, Clubbing or edema, No new Rash or bruise     Data Review:    CBC Recent Labs  Lab 04/14/19 1951 04/15/19 0755 04/16/19 0107 04/17/19 0058  WBC 7.5 8.3 7.8 7.0  HGB 14.6 13.6 12.8 13.5  HCT 42.0 38.0 35.5* 37.8  PLT 334 306 281 263  MCV 85.7 85.0 83.5 84.6  MCH 29.8 30.4 30.1 30.2  MCHC 34.8 35.8 36.1* 35.7  RDW 12.5 12.5 12.5 13.0  LYMPHSABS 1.4  --   --   --   MONOABS 0.7  --   --   --   EOSABS 0.2  --   --   --   BASOSABS 0.0  --   --   --     Chemistries  Recent Labs  Lab 04/14/19 1951 04/15/19 0755 04/16/19 0107 04/17/19 0058 04/18/19 0410 04/19/19 0510  NA 135 136 136 134* 139 140  K 3.9 3.6 3.3* 3.2* 4.0 4.2  CL 106 109 109 102 103 101  CO2 12* 11* 20* GLUCOSE 384* 244* 305* 260* 229* 126*  BUN 26* 21* 15  CREATININE 0.78 0.68 0.52 0.53 0.41* 0.33*  CALCIUM 9.0 8.2* 8.5* 8.4* 8.4* 8.7*  MG  --   --   --   --  2.1  --   AST 11* 13* 12* 14*  --  31  ALT --  25  ALKPHOS 87 76 70 72  --  66  BILITOT 1.7* 1.4* 0.7 0.5  --  0.4   ------------------------------------------------------------------------------------------------------------------ No results for input(s): CHOL, HDL, LDLCALC, TRIG,  CHOLHDL, LDLDIRECT in the last 72 hours.  Lab Results  Component Value Date   HGBA1C 14.3 (H) 04/15/2019   ------------------------------------------------------------------------------------------------------------------ Recent Labs    04/19/19 0520  TSH 1.029   ------------------------------------------------------------------------------------------------------------------ Recent Labs    04/17/19 0058  FERRITIN 131    Coagulation profile No results for input(s): INR, PROTIME in the last 168 hours.  Recent Labs    04/17/19 0058  DDIMER 0.96*    Cardiac Enzymes No results for input(s): CKMB, TROPONINI, MYOGLOBIN in the last 168 hours.  Invalid input(s): CK ------------------------------------------------------------------------------------------------------------------ No results found for: BNP  Micro Results Recent Results (from the past  240 hour(s))  MRSA PCR Screening     Status: None   Collection Time: 04/15/19  5:51 AM   Specimen: Nasopharyngeal  Result Value Ref Range Status   MRSA by PCR NEGATIVE NEGATIVE Final    Comment:        The GeneXpert MRSA Assay (FDA approved for NASAL specimens only), is one component of a comprehensive MRSA colonization surveillance program. It is not intended to diagnose MRSA infection nor to guide or monitor treatment for MRSA infections. Performed at [facility], [address] [place], [place] [address]     Radiology Reports Dg Chest Port 1 View  Result Date: 04/14/2019 CLINICAL DATA:  Pt arrived via [facility] from home with CC near syncople episode. Pt denies injury or pain but endorses generalized weakness. Hx of Type 1 Diabetes. Pt last ate 0900 oatmeal and has not taken any insulin today. Per EMS Afebrile CBG 441 VSS. Pt is COVID POS tested 9/18 [facility] EXAM: PORTABLE CHEST 1 VIEW COMPARISON:  04/08/2019 FINDINGS: Subtle areas of hazy airspace opacity noted on the prior radiographs  have improved. There are mostly resolved. No new lung abnormalities.  No pleural effusion or pneumothorax. Heart, mediastinum hila are within normal limits. Skeletal structures are unremarkable. IMPRESSION: 1. Improved areas of multifocal lung opacity when compared to the prior chest radiograph. 2. No new abnormalities. Electronically Signed   By: Amie Portlandavid  Ormond M.D.   On: 04/14/2019 19:54   Dg Chest Portable 1 View  Result Date: 04/08/2019 CLINICAL DATA:  Cough and shortness of breath for 1 week. Loss of taste. EXAM: PORTABLE CHEST 1 VIEW COMPARISON:  None. FINDINGS: The heart size and mediastinal contours are within normal limits. Mild airspace disease is seen in the left lower lung and right midlung suspicious for multifocal pneumonia, possibly viral pneumonia. No evidence of pleural effusion. IMPRESSION: Mild multifocal airspace disease in left lower lung and right midlung, suspicious for multifocal pneumonia, possibly viral pneumonia. Electronically Signed   By: Danae OrleansJohn A Stahl M.D.   On: 04/08/2019 08:29   Vas [place]as Lower Extremity Venous (dvt)  Result Date: 04/16/2019  Lower Venous Study Indications: Pain, and Covid-19 ++.  Comparison Study: No priors Performing Technologist: [place] Sinkita Sturdivant RDMS, RVT  Examination Guidelines: A complete evaluation includes B-mode imaging, spectral Doppler, color Doppler, and power Doppler as needed of all accessible portions of each vessel. Bilateral testing is considered an integral part of a complete examination. Limited examinations for reoccurring indications may be performed as noted.  +---------+---------------+---------+-----------+----------+--------------+  RIGHT     Compressibility Phasicity Spontaneity Properties Thrombus Aging  +---------+---------------+---------+-----------+----------+--------------+  CFV       Full            Yes       Yes                                    +---------+---------------+---------+-----------+----------+--------------+  SFJ        Full                                                             +---------+---------------+---------+-----------+----------+--------------+  FV Prox   Full                                                             +---------+---------------+---------+-----------+----------+--------------+  FV Mid    Full                                                             +---------+---------------+---------+-----------+----------+--------------+  FV Distal Full                                                             +---------+---------------+---------+-----------+----------+--------------+  PFV       Full                                                             +---------+---------------+---------+-----------+----------+--------------+  POP       Full            Yes       Yes                                    +---------+---------------+---------+-----------+----------+--------------+  PTV       Full                                                             +---------+---------------+---------+-----------+----------+--------------+  PERO      Full                                                             +---------+---------------+---------+-----------+----------+--------------+   +---------+---------------+---------+-----------+----------+--------------+  LEFT      Compressibility Phasicity Spontaneity Properties Thrombus Aging  +---------+---------------+---------+-----------+----------+--------------+  CFV       Full            Yes       Yes                                    +---------+---------------+---------+-----------+----------+--------------+  SFJ       Full                                                             +---------+---------------+---------+-----------+----------+--------------+  FV Prox   Full                                                             +---------+---------------+---------+-----------+----------+--------------+  FV Mid    Full                                                              +---------+---------------+---------+-----------+----------+--------------+  FV Distal Full                                                             +---------+---------------+---------+-----------+----------+--------------+  PFV       Full                                                             +---------+---------------+---------+-----------+----------+--------------+  POP       Full            Yes       Yes                                    +---------+---------------+---------+-----------+----------+--------------+  PTV       Full                                                             +---------+---------------+---------+-----------+----------+--------------+  PERO      Full                                                             +---------+---------------+---------+-----------+----------+--------------+     Summary: Right: There is no evidence of deep vein thrombosis in the lower extremity. No cystic structure found in the popliteal fossa. Left: There is no evidence of deep vein thrombosis in the lower extremity. No cystic structure found in the popliteal fossa.  *See table(s) above for measurements and observations. Electronically signed by Gretta Began MD on 04/16/2019 at 3:58:47 AM.    Final

## 2019-04-19 NOTE — TOC Progression Note (Addendum)
Transition of Care [facility]) - Progression Note    Patient Details  Name: [name] MRN: [id number] Date of Birth: 1970/08/12  Transition of [facility]) CM/SW Contact  Loletha Grayer Beverely Pace, RN Phone Number: [phone] (working remotely) 04/19/2019, 1:02 PM  Clinical Narrative:   Case manager was asked to contact patient concerning her many needs. Patient is improving thankfully from [place] and preparing to discharge home. Case manager contacted patient via telephone. She requests "help like agencies in other cities provide". CM explained that we will be arranging for Amelia with [place] [facility]) and will be arranging for [facility] to transport her home, we can not get her a blood pressure monitor or help with her social needs. Patient has been walking independently up and down unit per staff but requests a rollator. Patient is uninsured and this has not been requested by therapy. Case manager spoke with Dr. Candiss Norse concerning patient's unrealistic requests and expectations. If needed patient's medications can be [facility] and CM will provide MATCH. She states she has no one to go to pharmacy and she has no funds. Financial counselor has spoken with patient and she says she "knows she doesn't qualify" for medicaid.  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 This disqualifies her for [facility] assistance for [facility] services. MD notified.  1400: Case manager has arranged telephonic hospital follow up appt for patient on Wednesday, May 18, 2019, at 9:30am. With provider from [facility]. Appointment will be entered on AVS.    Expected Discharge Plan: [place] Barriers to Discharge: No Barriers Identified  Expected Discharge Plan and Services Expected Discharge Plan: [place]   Discharge Planning Services: CM Consult Post Acute Care Choice: [place] arrangements for the past 2 months: Apartment                            HH Arranged: PT [place]: [place] ([place]) Date HH Agency Contacted: 04/19/19 Time [place]: 1301 Representative spoke with at [place]: [place] ([place]) Interventions    Readmission Risk Interventions No flowsheet data found.

## 2019-04-19 NOTE — Discharge Instructions (Signed)
Follow with Primary MD in 7 days   Get CBC, CMP, 2 view Chest X ray -  checked next visit within 1 week by Primary MD   Activity: As tolerated with Full fall precautions use walker/cane & assistance as needed  Disposition Home    Diet: Heart Healthy Low Carb  Accuchecks 4 times/day, Once in AM empty stomach and then before each meal. Log in all results and show them to your Prim.MD in 3 days. If any glucose reading is under 80 or above 300 call your Prim MD immidiately. Follow Low glucose instructions for glucose under 80 as instructed.   Special Instructions: If you have smoked or chewed Tobacco  in the last 2 yrs please stop smoking, stop any regular Alcohol  and or any Recreational drug use.  On your next visit with your primary care physician please Get Medicines reviewed and adjusted.  Please request your Prim.MD to go over all Hospital Tests and Procedure/Radiological results at the follow up, please get all Hospital records sent to your Prim MD by signing hospital release before you go home.  If you experience worsening of your admission symptoms, develop shortness of breath, life threatening emergency, suicidal or homicidal thoughts you must seek medical attention immediately by calling 911 or calling your MD immediately  if symptoms less severe.  You Must read complete instructions/literature along with all the possible adverse reactions/side effects for all the Medicines you take and that have been prescribed to you. Take any new Medicines after you have completely understood and accpet all the possible adverse reactions/side effects.   Do not drive, operate heavy machinery, perform activities at heights, swimming or participation in water activities or provide baby sitting services if your were admitted for syncope or siezures until you have seen by Primary MD or a Neurologist and advised to do so again.  Do not drive when taking Pain medications.  Do not take more than  prescribed Pain, Sleep and Anxiety Medications  Wear Seat belts while driving.   Please note  You were cared for by a hospitalist during your hospital stay. If you have any questions about your discharge medications or the care you received while you were in the hospital after you are discharged, you can call the unit and asked to speak with the hospitalist on call if the hospitalist that took care of you is not available. Once you are discharged, your primary care physician will handle any further medical issues. Please note that NO REFILLS for any discharge medications will be authorized once you are discharged, as it is imperative that you return to your primary care physician (or establish a relationship with a primary care physician if you do not have one) for your aftercare needs so that they can reassess your need for medications and monitor your lab values.      Person Under Monitoring Name: [name]  Location[place] [address] [place] [id number]   Infection Prevention Recommendations for Individuals Confirmed to have, or Being Evaluated for, 2019 Novel Coronavirus (COVID-19) Infection Who Receive Care at Home  Individuals who are confirmed to have, or are being evaluated for, COVID-19 should follow the prevention steps below until a healthcare provider or local or state health department says they can return to normal activities.  Stay home except to get medical care You should restrict activities outside your home, except for getting medical care. Do not go to work, school, or public areas, and do not use  public transportation or taxis.  Call ahead before visiting your doctor Before your medical appointment, call the healthcare provider and tell them that you have, or are being evaluated for, COVID-19 infection. This will help the healthcare providers office take steps to keep other people from getting infected. Ask your healthcare provider to call the local or  state health department.  Monitor your symptoms Seek prompt medical attention if your illness is worsening (e.g., difficulty breathing). Before going to your medical appointment, call the healthcare provider and tell them that you have, or are being evaluated for, COVID-19 infection. Ask your healthcare provider to call the local or state health department.  Wear a facemask You should wear a facemask that covers your nose and mouth when you are in the same room with other people and when you visit a healthcare provider. People who live with or visit you should also wear a facemask while they are in the same room with you.  Separate yourself from other people in your home As much as possible, you should stay in a different room from other people in your home. Also, you should use a separate bathroom, if available.  Avoid sharing household items You should not share dishes, drinking glasses, cups, eating utensils, towels, bedding, or other items with other people in your home. After using these items, you should wash them thoroughly with soap and water.  Cover your coughs and sneezes Cover your mouth and nose with a tissue when you cough or sneeze, or you can cough or sneeze into your sleeve. Throw used tissues in a lined trash can, and immediately wash your hands with soap and water for at least 20 seconds or use an alcohol-based hand rub.  Wash your [facility] your hands often and thoroughly with soap and water for at least 20 seconds. You can use an alcohol-based hand sanitizer if soap and water are not available and if your hands are not visibly dirty. Avoid touching your eyes, nose, and mouth with unwashed hands.   Prevention Steps for Caregivers and Household Members of Individuals Confirmed to have, or Being Evaluated for, COVID-19 Infection Being Cared for in the Home  If you live with, or provide care at home for, a person confirmed to have, or being evaluated for, COVID-19  infection please follow these guidelines to prevent infection:  Follow healthcare providers instructions Make sure that you understand and can help the patient follow any healthcare provider instructions for all care.  Provide for the patients basic needs You should help the patient with basic needs in the home and provide support for getting groceries, prescriptions, and other personal needs.  Monitor the patients symptoms If they are getting sicker, call his or her medical provider and tell them that the patient has, or is being evaluated for, COVID-19 infection. This will help the healthcare providers office take steps to keep other people from getting infected. Ask the healthcare provider to call the local or state health department.  Limit the number of people who have contact with the patient  If possible, have only one caregiver for the patient.  Other household members should stay in another home or place of residence. If this is not possible, they should stay  in another room, or be separated from the patient as much as possible. Use a separate bathroom, if available.  Restrict visitors who do not have an essential need to be in the home.  Keep older adults, very young children, and other sick  people away from the patient Keep older adults, very young children, and those who have compromised immune systems or chronic health conditions away from the patient. This includes people with chronic heart, lung, or kidney conditions, diabetes, and cancer.  Ensure good ventilation Make sure that shared spaces in the home have good air flow, such as from an air conditioner or an opened window, weather permitting.  Wash your hands often  Wash your hands often and thoroughly with soap and water for at least 20 seconds. You can use an alcohol based hand sanitizer if soap and water are not available and if your hands are not visibly dirty.  Avoid touching your eyes, nose, and mouth  with unwashed hands.  Use disposable paper towels to dry your hands. If not available, use dedicated cloth towels and replace them when they become wet.  Wear a facemask and gloves  Wear a disposable facemask at all times in the room and gloves when you touch or have contact with the patients blood, body fluids, and/or secretions or excretions, such as sweat, saliva, sputum, nasal mucus, vomit, urine, or feces.  Ensure the mask fits over your nose and mouth tightly, and do not touch it during use.  Throw out disposable facemasks and gloves after using them. Do not reuse.  Wash your hands immediately after removing your facemask and gloves.  If your personal clothing becomes contaminated, carefully remove clothing and launder. Wash your hands after handling contaminated clothing.  Place all used disposable facemasks, gloves, and other waste in a lined container before disposing them with other household waste.  Remove gloves and wash your hands immediately after handling these items.  Do not share dishes, glasses, or other household items with the patient  Avoid sharing household items. You should not share dishes, drinking glasses, cups, eating utensils, towels, bedding, or other items with a patient who is confirmed to have, or being evaluated for, COVID-19 infection.  After the person uses these items, you should wash them thoroughly with soap and water.  Wash laundry thoroughly  Immediately remove and wash clothes or bedding that have blood, body fluids, and/or secretions or excretions, such as sweat, saliva, sputum, nasal mucus, vomit, urine, or feces, on them.  Wear gloves when handling laundry from the patient.  Read and follow directions on labels of laundry or clothing items and detergent. In general, wash and dry with the warmest temperatures recommended on the label.  Clean all areas the individual has used often  Clean all touchable surfaces, such as counters, tabletops,  doorknobs, bathroom fixtures, toilets, phones, keyboards, tablets, and bedside tables, every day. Also, clean any surfaces that may have blood, body fluids, and/or secretions or excretions on them.  Wear gloves when cleaning surfaces the patient has come in contact with.  Use a diluted bleach solution (e.g., dilute bleach with 1 part bleach and 10 parts water) or a household disinfectant with a label that says EPA-registered for coronaviruses. To make a bleach solution at home, add 1 tablespoon of bleach to 1 quart (4 cups) of water. For a larger supply, add  cup of bleach to 1 gallon (16 cups) of water.  Read labels of cleaning products and follow recommendations provided on product labels. Labels contain instructions for safe and effective use of the cleaning product including precautions you should take when applying the product, such as wearing gloves or eye protection and making sure you have good ventilation during use of the product.  Remove gloves and  wash hands immediately after cleaning.  Monitor yourself for signs and symptoms of illness Caregivers and household members are considered close contacts, should monitor their health, and will be asked to limit movement outside of the home to the extent possible. Follow the monitoring steps for close contacts listed on the symptom monitoring form.   ? If you have additional questions, contact your local health department or call the epidemiologist on call at [phone] (available 24/7). ? This guidance is subject to change. For the most up-to-date guidance from [facility], please refer to their website: [email or web address]

## 2019-04-19 NOTE — Progress Notes (Addendum)
Pt assessed. Pt not in distress. No change from initial baseline upon morning assessment. Pt walked up hallway with bedside RN. No distress noted from patient. Pt tolerated well. No desat in O2. O2 maintained at 99% on room air. No other concerns at this time.

## 2019-04-20 LAB — GLUCOSE, CAPILLARY
Glucose-Capillary: 265 mg/dL — ABNORMAL HIGH (ref 70–99)
Glucose-Capillary: 331 mg/dL — ABNORMAL HIGH (ref 70–99)
Glucose-Capillary: 320 mg/dL — ABNORMAL HIGH (ref 70–99)
Glucose-Capillary: 110 mg/dL — ABNORMAL HIGH (ref 70–99)
Glucose-Capillary: 166 mg/dL — ABNORMAL HIGH (ref 70–99)

## 2019-04-20 MED ORDER — FLUDROCORTISONE ACETATE 0.1 MG PO TABS
0.1000 mg | ORAL_TABLET | Freq: Every day | ORAL | Status: DC
  Administered 2019-04-20: 0.1 mg via ORAL
  Filled 2019-04-20 (×2): qty 1

## 2019-04-20 MED ORDER — FLUDROCORTISONE ACETATE 0.1 MG PO TABS: 0.1000 mg | ORAL_TABLET | Freq: Every day | ORAL | 0 refills | Status: DC

## 2019-04-20 MED ORDER — DEXAMETHASONE 0.5 MG PO TABS
1.0000 mg | ORAL_TABLET | Freq: Every day | ORAL | Status: DC
  Filled 2019-04-20: qty 2

## 2019-04-20 MED ORDER — INSULIN ASPART 100 UNIT/ML ~~LOC~~ SOLN
3.0000 [IU] | Freq: Three times a day (TID) | SUBCUTANEOUS | Status: DC
  Administered 2019-04-20 (×2): 3 [IU] via SUBCUTANEOUS

## 2019-04-20 MED ORDER — MIDODRINE HCL 5 MG PO TABS: 5.0000 mg | ORAL_TABLET | Freq: Three times a day (TID) | ORAL | 0 refills | Status: AC

## 2019-04-20 MED ORDER — FLUDROCORTISONE ACETATE 0.1 MG PO TABS: 0.1000 mg | ORAL_TABLET | Freq: Every day | ORAL | 0 refills | Status: AC

## 2019-04-20 MED ORDER — MIDODRINE HCL 5 MG PO TABS: 5.0000 mg | ORAL_TABLET | Freq: Three times a day (TID) | ORAL | 0 refills | Status: DC

## 2019-04-20 MED FILL — MIDODRINE HCL 5 MG TABLET: 5 | 10 days supply | Qty: 30 | Fill #0

## 2019-04-20 MED FILL — FLUDROCORTISONE 0.1 MG TAB: 0.1 | 30 days supply | Qty: 30 | Fill #0

## 2019-04-20 NOTE — Progress Notes (Signed)
Physical Therapy Treatment Patient Details Name: [name] MRN: [id number] DOB: [birth year] Today's Date: 04/20/2019    History of Present Illness 48 y.o. female admitted on 04/14/19 for SOB and LOC/syncope.  Pt dx with COVID 19 viral PNA and gastroenteritis, orthostatic hypotensionwith autonomic instability (thought to be due to poorly controlled DM).  Pt with significant PMH of diabetes, depression, patellar release and manipulation.     PT Comments    Discussed with pt d/c and importance of self monitoring. Worked on ambulation with 4ww and pt did quite well. Still slightly anxious but seemed more receptive of d/c home today. Abdominal binder has yet to arrive at time of session but nurse reports will f/u with this prior to d/c.    Follow Up Recommendations  Home health PT     Equipment Recommendations  Other (comment)(BP monitor)    Recommendations for Other Services       Precautions / Restrictions Precautions Precautions: Fall Precaution Comments: (+) orthostatic hypotension` Required Braces or Orthoses: (abdominal brace) Restrictions Weight Bearing Restrictions: No    Mobility  Bed Mobility Overal bed mobility: Independent                Transfers Overall transfer level: Modified independent Equipment used: 4-wheeled walker Transfers: Sit to/from [facility] Sit to Stand: Modified independent (Device/Increase time) Stand pivot transfers: Modified independent (Device/Increase time)       General transfer comment: therapist still stayed close to monitor  Ambulation/Gait Ambulation/Gait assistance: Supervision Gait Distance (Feet): 100 Feet Assistive device: 4-wheeled walker Gait Pattern/deviations: WFL(Within Functional Limits) Gait velocity: slow(pt states she feels shaky)   General Gait Details: stayed close incase of need but no need arose   Stairs             Wheelchair Mobility    Modified Rankin (Stroke Patients  Only)       Balance Overall balance assessment: Modified Independent Sitting-balance support: Feet supported Sitting balance-Leahy Scale: Good     Standing balance support: Bilateral upper extremity supported Standing balance-Leahy Scale: Fair                              Cognition Arousal/Alertness: Awake/alert Behavior During Therapy: WFL for tasks assessed/performed Overall Cognitive Status: Within Functional Limits for tasks assessed                                        Exercises      General Comments General comments (skin integrity, edema, etc.): Pt seems to be in calmer mood today and is accepting of impending d/c, she states she will feel better once in home environment and will be able to figure out how to proceed with different tasks. at time of tx abdominal binder ordered had not arrived but nurse has reported will f/u with this.      Pertinent Vitals/Pain Pain Assessment: Faces Faces Pain Scale: Hurts a little bit Pain Location: head  Pain Descriptors / Indicators: Aching Pain Intervention(s): Limited activity within patient's tolerance;Monitored during session    Home Living                      Prior Function            PT Goals (current goals can now be found in the care plan section) Acute Rehab PT Goals  Patient Stated Goal: to go home to be around own environment PT Goal Formulation: With patient Time For Goal Achievement: 05/02/19 Potential to Achieve Goals: Good Progress towards PT goals: Progressing toward goals    Frequency    Min 3X/week      PT Plan Current plan remains appropriate    Co-evaluation              AM-PAC PT "6 Clicks" Mobility   Outcome Measure  Help needed turning from your back to your side while in a flat bed without using bedrails?: None Help needed moving from lying on your back to sitting on the side of a flat bed without using bedrails?: None Help needed moving to  and from a bed to a chair (including a wheelchair)?: None Help needed standing up from a chair using your arms (e.g., wheelchair or bedside chair)?: None Help needed to walk in hospital room?: None Help needed climbing 3-5 steps with a railing? : A Little 6 Click Score: 23    End of Session Equipment Utilized During Treatment: Other (comment)(4WW) Activity Tolerance: Treatment limited secondary to medical complications (Comment) Patient left: in chair;with call bell/phone within reach;with nursing/sitter in room Nurse Communication: Mobility status(abdominal binder, AD for home) PT Visit Diagnosis: Difficulty in walking, not elsewhere classified (R26.2)     Time: 4818-5631 PT Time Calculation (min) (ACUTE ONLY): 38 min  Charges:  $Gait Training: 23-37 mins $Therapeutic Activity: 8-22 mins                     Drema Pry, PT    Freddi Starr 04/20/2019, 2:19 PM

## 2019-04-20 NOTE — Discharge Summary (Signed)
[name] WUJ:[id number] DOB: [birth year] DOA: 04/14/2019  PCP: Patient, No Pcp Per  Admit date: 04/14/2019  Discharge date: 04/20/2019  Admitted From: Home   Disposition:  Home   Recommendations for Outpatient Follow-up:   Follow up with PCP in 1-2 weeks  PCP Please obtain BMP/CBC, 2 view CXR in 1week,  (see Discharge instructions)   PCP Please follow up on the following pending results: Needs outpatient endocrine follow-up for a cortisol stimulation test in the next 7 to 10 days.   Home Health: RN, PT   Equipment/Devices: Rolling walker  Consultations: None  Discharge Condition: Stable    CODE STATUS: Full    Diet Recommendation: Heart Healthy Low Carb  Diet Order            Diet Carb Modified Fluid consistency: Thin; Room service appropriate? Yes  Diet effective now               Chief Complaint  Patient presents with   Weakness    COVID POSITIVE     Brief history of present illness from the day of admission and additional interim summary    Patient is a 48 y.o. female with PMHx of insulin-dependent DM, depression- diagnosed with COVID-19 on 9/18-presented with worsening exertional dyspnea, and a syncopal episode.  She was subsequently admitted to the hospitalist service.  See below for further details                                                                 Hospital Course   Covid 19 Viral pneumonia and gastroenteritis: He was treated with steroids and Remdisvir, no oxygen need, remains stable on room air.  Inflammatory markers are stable.  Advance activity and monitor.  GI symptoms have completely resolved.  He has finished her Remdisvir course along with her steroid course.   Syncope: Likely due to combination of underlying autonomic instability due to extremely poorly controlled  diabetes mellitus worsened by dehydration from diarrhea, she has now received about 4 L of IV fluid, has been placed on Middaugh drain along with thigh-high TED stockings.  Although she is mildly orthostatic but asymptomatic from the standpoint, has been ambulated by nurses on 04/18/2019 and 04/19/2019 without any symptoms or assistance.    She was also recommended to wear TED stockings at all times at home when she is out of bed, wear abdominal binder which she already has.  Although technically she is orthostatic but completely asymptomatic ambulating without any symptoms which makes me suspect that her orthostatic hypotension might be chronic and likely due to underlying autonomic instability/dysfunction due to poorly controlled DM.  Her TSH was stable her random cortisol was quite low at 0.5 but this was in the setting of her getting steroids for COVID-19.  At this time she  is being placed on midodrine along with Florinef, will avoid corticosteroids due to poorly controlled diabetes mellitus and requested to follow-up with endocrine within a week both for outpatient cosyntropin stimulation test and diabetes mellitus monitoring.  Also get home PT and rolling walker.  Again symptom-free when ambulating in the room.   Hypokalemia.  Replaced and stable.  Extreme anxiety.   Her mood was all over the place up and down, we requested psychiatry to see the patient here on 04/19/2019 but they were unable to do so, this morning patient states she feels much better and would follow with her psychiatrist outpatient.  She was at no point suicidal homicidal but at times she was extremely anxious, also her story about her relatives was pretty inconsistent and never allowed me to call any family member.  Insulin-dependent DM with uncontrolled hyperglycemia (A1c 14.3 on 04/15/2019): Extremely poorly controlled outpatient due to hyperglycemia A1c of 14.3, currently on Tresiba and sliding scale, pre-meal NovoLog increased  for better control.  Steroids being tapered will monitor closely.  She claims that her outpatient control has been poor due to poor follow-up by her outpatient physicians.  New endocrinologist will be recommended upon discharge.  Depression: Stable-continue Zoloft  Candida vaginitis:   Diflucan x3 days.     Discharge diagnosis     Principal Problem:   Acute respiratory failure with hypoxia (HCC) Active Problems:   COVID-19 virus infection   Syncope and collapse   Diabetes mellitus type 1 [facility])    Discharge instructions    Discharge Instructions    Discharge instructions   Complete by: As directed    Follow with Primary MD in 7 days   Get CBC, CMP, 2 view Chest X ray -  checked next visit within 1 week by Primary MD   Activity: As tolerated with Full fall precautions use walker/cane & assistance as needed  Disposition Home    Diet: Heart Healthy Low Carb  Accuchecks 4 times/day, Once in AM empty stomach and then before each meal. Log in all results and show them to your Prim.MD in 3 days. If any glucose reading is under 80 or above 300 call your Prim MD immidiately. Follow Low glucose instructions for glucose under 80 as instructed.   Special Instructions: If you have smoked or chewed Tobacco  in the last 2 yrs please stop smoking, stop any regular Alcohol  and or any Recreational drug use.  On your next visit with your primary care physician please Get Medicines reviewed and adjusted.  Please request your Prim.MD to go over all Hospital Tests and Procedure/Radiological results at the follow up, please get all Hospital records sent to your Prim MD by signing hospital release before you go home.  If you experience worsening of your admission symptoms, develop shortness of breath, life threatening emergency, suicidal or homicidal thoughts you must seek medical attention immediately by calling 911 or calling your MD immediately  if symptoms less severe.  You Must read  complete instructions/literature along with all the possible adverse reactions/side effects for all the Medicines you take and that have been prescribed to you. Take any new Medicines after you have completely understood and accpet all the possible adverse reactions/side effects.   Do not drive, operate heavy machinery, perform activities at heights, swimming or participation in water activities or provide baby sitting services if your were admitted for syncope or siezures until you have seen by Primary MD or a Neurologist and advised to do so again.  Do not drive when taking Pain medications.  Do not take more than prescribed Pain, Sleep and Anxiety Medications  Wear Seat belts while driving.   Please note  You were cared for by a hospitalist during your hospital stay. If you have any questions about your discharge medications or the care you received while you were in the hospital after you are discharged, you can call the unit and asked to speak with the hospitalist on call if the hospitalist that took care of you is not available. Once you are discharged, your primary care physician will handle any further medical issues. Please note that NO REFILLS for any discharge medications will be authorized once you are discharged, as it is imperative that you return to your primary care physician (or establish a relationship with a primary care physician if you do not have one) for your aftercare needs so that they can reassess your need for medications and monitor your lab values.   Increase activity slowly   Complete by: As directed    MyChart COVID-19 home monitoring program   Complete by: Apr 20, 2019    Is the patient willing to use the MyChart Mobile App for home monitoring?: Yes   Temperature monitoring   Complete by: Apr 20, 2019    After how many days would you like to receive a notification of this patient's flowsheet entries?: 1      Discharge Medications   Allergies as of 04/20/2019        Reactions   Lantus [insulin Glargine] Other (See Comments)   Reports Leg pain/cramps      Medication List    STOP taking these medications   azithromycin 250 MG tablet Commonly known as: ZITHROMAX   HYDROcodone-acetaminophen 5-325 MG tablet Commonly known as: NORCO/VICODIN   ibuprofen 600 MG tablet Commonly known as: ADVIL   insulin glargine 100 UNIT/ML injection Commonly known as: LANTUS   sulfamethoxazole-trimethoprim 800-160 MG tablet Commonly known as: Septra DS     TAKE these medications   buPROPion 150 MG 24 hr tablet Commonly known as: WELLBUTRIN XL Take 150 mg by mouth daily.   citalopram 40 MG tablet Commonly known as: CELEXA Take 40 mg by mouth daily.   fludrocortisone 0.1 MG tablet Commonly known as: FLORINEF Take 1 tablet (0.1 mg total) by mouth daily.   gabapentin 100 MG capsule Commonly known as: NEURONTIN Take 100 mg by mouth 3 (three) times daily.   insulin aspart 100 UNIT/ML injection Commonly known as: novoLOG Inject 0-15 Units into the skin 3 (three) times daily with meals. 1:6 grams and 1 units for every 30 pts > 130 mg/dL   midodrine 5 MG tablet Commonly known as: PROAMATINE Take 1 tablet (5 mg total) by mouth 3 (three) times daily with meals.   sertraline 100 MG tablet Commonly known as: ZOLOFT Take 100 mg by mouth daily.   traMADol 50 MG tablet Commonly known as: ULTRAM Take 1 tablet (50 mg total) by mouth every 6 (six) hours as needed for pain.   Evaristo Bury FlexTouch 100 UNIT/ML Sopn FlexTouch Pen Generic drug: insulin degludec Inject 36 Units into the skin at bedtime.            Durable Medical Equipment  (From admission, onward)         Start     Ordered   04/20/19 0906  For home use only DME Walker rolling  [facility])  Once    Question:  Patient needs a walker to treat  with the following condition  Answer:  Syncope   04/20/19 1610          Follow-up Information    Carlus Pavlov, MD. Schedule an  appointment as soon as possible for a visit in 1 week(s).   Specialty: Internal Medicine Contact information: [address] [place] [address] [phone]        [facility] Follow up.   Why: You are scheduled for a telephonic hospital follow up call on Wed. October 28,2020 at 9:30am. Please be available for this call. If you must reschedule please call [phone]  Contact information: [address], [address] [place] [address]       Follow with your Psychiatrist withing 1 week. Schedule an appointment as soon as possible for a visit in 1 week(s).           Major procedures and Radiology Reports - PLEASE review detailed and final reports thoroughly  -        Dg Chest Port 1 View  Result Date: 04/14/2019 CLINICAL DATA:  Pt arrived via [facility] from home with CC near syncople episode. Pt denies injury or pain but endorses generalized weakness. Hx of Type 1 Diabetes. Pt last ate 0900 oatmeal and has not taken any insulin today. Per EMS Afebrile CBG 441 VSS. Pt is COVID POS tested 9/18 [facility] EXAM: PORTABLE CHEST 1 VIEW COMPARISON:  04/08/2019 FINDINGS: Subtle areas of hazy airspace opacity noted on the prior radiographs have improved. There are mostly resolved. No new lung abnormalities.  No pleural effusion or pneumothorax. Heart, mediastinum hila are within normal limits. Skeletal structures are unremarkable. IMPRESSION: 1. Improved areas of multifocal lung opacity when compared to the prior chest radiograph. 2. No new abnormalities. Electronically Signed   By: Amie Portland M.D.   On: 04/14/2019 19:54   Dg Chest Portable 1 View  Result Date: 04/08/2019 CLINICAL DATA:  Cough and shortness of breath for 1 week. Loss of taste. EXAM: PORTABLE CHEST 1 VIEW COMPARISON:  None. FINDINGS: The heart size and mediastinal contours are within normal limits. Mild airspace disease is seen in the left lower lung and right  midlung suspicious for multifocal pneumonia, possibly viral pneumonia. No evidence of pleural effusion. IMPRESSION: Mild multifocal airspace disease in left lower lung and right midlung, suspicious for multifocal pneumonia, possibly viral pneumonia. Electronically Signed   By: Danae Orleans M.D.   On: 04/08/2019 08:29   Vas [place] Lower Extremity Venous (dvt)  Result Date: 04/16/2019  Lower Venous Study Indications: Pain, and Covid-19 ++.  Comparison Study: No priors Performing Technologist: [place] Sink [place] RDMS, RVT  Examination Guidelines: A complete evaluation includes B-mode imaging, spectral Doppler, color Doppler, and power Doppler as needed of all accessible portions of each vessel. Bilateral testing is considered an integral part of a complete examination. Limited examinations for reoccurring indications may be performed as noted.  +---------+---------------+---------+-----------+----------+--------------+  RIGHT     Compressibility Phasicity Spontaneity Properties Thrombus Aging  +---------+---------------+---------+-----------+----------+--------------+  CFV       Full            Yes       Yes                                    +---------+---------------+---------+-----------+----------+--------------+  SFJ       Full                                                             +---------+---------------+---------+-----------+----------+--------------+  FV Prox   Full                                                             +---------+---------------+---------+-----------+----------+--------------+  FV Mid    Full                                                             +---------+---------------+---------+-----------+----------+--------------+  FV Distal Full                                                             +---------+---------------+---------+-----------+----------+--------------+  PFV       Full                                                              +---------+---------------+---------+-----------+----------+--------------+  POP       Full            Yes       Yes                                    +---------+---------------+---------+-----------+----------+--------------+  PTV       Full                                                             +---------+---------------+---------+-----------+----------+--------------+  PERO      Full                                                             +---------+---------------+---------+-----------+----------+--------------+   +---------+---------------+---------+-----------+----------+--------------+  LEFT      Compressibility Phasicity Spontaneity Properties Thrombus Aging  +---------+---------------+---------+-----------+----------+--------------+  CFV       Full            Yes       Yes                                    +---------+---------------+---------+-----------+----------+--------------+  SFJ       Full                                                             +---------+---------------+---------+-----------+----------+--------------+  FV Prox   Full                                                             +---------+---------------+---------+-----------+----------+--------------+  FV Mid    Full                                                             +---------+---------------+---------+-----------+----------+--------------+  FV Distal Full                                                             +---------+---------------+---------+-----------+----------+--------------+  PFV       Full                                                             +---------+---------------+---------+-----------+----------+--------------+  POP       Full            Yes       Yes                                    +---------+---------------+---------+-----------+----------+--------------+  PTV       Full                                                              +---------+---------------+---------+-----------+----------+--------------+  PERO      Full                                                             +---------+---------------+---------+-----------+----------+--------------+     Summary: Right: There is no evidence of deep vein thrombosis in the lower extremity. No cystic structure found in the popliteal fossa. Left: There is no evidence of deep vein thrombosis in the lower extremity. No cystic structure found in the popliteal fossa.  *See table(s) above for measurements and observations. Electronically signed by Gretta Beganodd Early MD on 04/16/2019 at 3:58:47 AM.    Final     Micro Results     Recent Results (from the past 240 hour(s))  MRSA PCR Screening     Status: None   Collection Time: 04/15/19  5:51 AM   Specimen: Nasopharyngeal  Result Value Ref Range Status   MRSA by PCR NEGATIVE NEGATIVE Final    Comment:  The GeneXpert MRSA Assay (FDA approved for NASAL specimens only), is one component of a comprehensive MRSA colonization surveillance program. It is not intended to diagnose MRSA infection nor to guide or monitor treatment for MRSA infections. Performed at [facility], [address] [place], [place] [address]     Today   Subjective    Ameia Morency today has no headache,no chest abdominal pain,no new weakness tingling or numbness, feels much better wants to go home today.  She says she feels good today and would like to follow-up with her psychiatrist.  York Spaniel that she has already talked to her family and there is no point of me calling them.   Objective   Blood pressure 109/76, pulse 81, temperature 97.6 F (36.4 C), temperature source Oral, resp. rate (!) 23, height 5\' 6"  (1.676 m), weight 67.7 kg, last menstrual period 03/24/2019, SpO2 97 %.   Intake/Output Summary (Last 24 hours) at 04/20/2019 0919 Last data filed at 04/20/2019 0605 Gross per 24 hour  Intake 1450 ml  Output --  Net 1450 ml     Exam  Awake Alert, Oriented x 3, No new F.N deficits, Normal affect today [place].AT,PERRAL Supple Neck,No JVD, No cervical lymphadenopathy appriciated.  Symmetrical Chest wall movement, Good air movement bilaterally, CTAB RRR,No Gallops,Rubs or new Murmurs, No Parasternal Heave +ve B.Sounds, Abd Soft, Non tender, No organomegaly appriciated, No rebound -guarding or rigidity. No Cyanosis, Clubbing or edema, No new Rash or bruise   Data Review   CBC w Diff:  Lab Results  Component Value Date   WBC 7.0 04/17/2019   HGB 13.5 04/17/2019   HCT 37.8 04/17/2019   PLT 263 04/17/2019   LYMPHOPCT 18 04/14/2019   MONOPCT 9 04/14/2019   EOSPCT 2 04/14/2019   BASOPCT 1 04/14/2019    CMP:  Lab Results  Component Value Date   NA 140 04/19/2019   K 4.2 04/19/2019   CL 101 04/19/2019   CO2 30 04/19/2019   BUN 15 04/19/2019   CREATININE 0.33 (L) 04/19/2019   PROT 6.1 (L) 04/19/2019   ALBUMIN 3.1 (L) 04/19/2019   BILITOT 0.4 04/19/2019   ALKPHOS 66 04/19/2019   AST 31 04/19/2019   ALT 25 04/19/2019  . Lab Results  Component Value Date   HGBA1C 14.3 (H) 04/15/2019    Lab Results  Component Value Date   SARSCOV2NAA POSITIVE (A) 04/08/2019     Total Time in preparing paper work, data evaluation and todays exam - 35 minutes  04/10/2019 M.D on 04/20/2019 at 9:19 AM  [facility]   Office  [phone]

## 2019-04-20 NOTE — TOC Transition Note (Signed)
Transition of Care [facility]) - CM/SW Discharge Note   Patient Details  Name: [name] MRN: [id number] Date of Birth: 1970/09/05  Transition of [facility]) CM/SW Contact:  Ninfa Meeker, RN Phone Number: [phone]  (working remotely) 04/20/2019, 11:22 AM   Clinical Narrative:   Case manager spoke with patient via telephone letting her know that [place] assistance is being provided for her medications and will arrive at [facility] via courier between 3-4 pm. Case manager let her know that she will be transported home after meds are delivered. Case Manager will schedule PTAR for 5pm pickup. Medical necessity form and face sheet have been printed to nursing unit, case manager has updated Bedside RN, Rodman Key.  Final next level of care: [place] Barriers to Discharge: No Barriers Identified   Patient Goals and CMS Choice     Choice offered to / list presented to : Patient  Discharge Placement                       Discharge Plan and Services   Discharge Planning Services: CM Consult, [place] Program, Other - See comment(arrange PTAR transport home) Post Acute Care Choice: Home Health                    HH Arranged: PT [place]: [place] ([place]) Date HH Agency Contacted: 04/19/19 Time [place]: 1301 Representative spoke with at [place]: [place] ([place]) Interventions     Readmission Risk Interventions No flowsheet data found.

## 2019-04-20 NOTE — Discharge Planning (Signed)
Patient IV removed.  RN assessment and VS revealing stability for DC to home with [facility].  DC scipts/meds being delivered to patient prior to DC (set up through CM).  Discussed importance of self quarantine for additional 2 weeks beyond DC.  Talked about self monitoring symptoms for self and any other individuals that may have been exposed to her (prior to admission).  Asked to contact PCP is anyone shows signs of diff breathing or uncontrolled temp.  DC contract agreed to, signed and placed on chart. Plan is to set up PTAR for about 1700, once DC meds have been delivered.

## 2019-05-18 ENCOUNTER — Inpatient Hospital Stay: Payer: Self-pay

## 2022-08-18 DIAGNOSIS — R69 Illness, unspecified: Secondary | ICD-10-CM | POA: Diagnosis not present

## 2022-08-18 DIAGNOSIS — F410 Panic disorder [episodic paroxysmal anxiety] without agoraphobia: Secondary | ICD-10-CM | POA: Diagnosis not present

## 2022-08-18 DIAGNOSIS — F5105 Insomnia due to other mental disorder: Secondary | ICD-10-CM | POA: Diagnosis not present

## 2022-08-18 DIAGNOSIS — F411 Generalized anxiety disorder: Secondary | ICD-10-CM | POA: Diagnosis not present

## 2022-08-18 DIAGNOSIS — Z79899 Other long term (current) drug therapy: Secondary | ICD-10-CM | POA: Diagnosis not present

## 2022-08-25 DIAGNOSIS — R69 Illness, unspecified: Secondary | ICD-10-CM | POA: Diagnosis not present

## 2022-08-25 DIAGNOSIS — S9031XA Contusion of right foot, initial encounter: Secondary | ICD-10-CM | POA: Diagnosis not present

## 2022-08-25 DIAGNOSIS — Y929 Unspecified place or not applicable: Secondary | ICD-10-CM | POA: Diagnosis not present

## 2022-08-25 DIAGNOSIS — E871 Hypo-osmolality and hyponatremia: Secondary | ICD-10-CM | POA: Diagnosis not present

## 2022-08-25 DIAGNOSIS — R110 Nausea: Secondary | ICD-10-CM | POA: Diagnosis not present

## 2022-08-25 DIAGNOSIS — R112 Nausea with vomiting, unspecified: Secondary | ICD-10-CM | POA: Diagnosis not present

## 2022-08-25 DIAGNOSIS — E1010 Type 1 diabetes mellitus with ketoacidosis without coma: Secondary | ICD-10-CM | POA: Diagnosis not present

## 2022-08-25 DIAGNOSIS — M609 Myositis, unspecified: Secondary | ICD-10-CM | POA: Diagnosis not present

## 2022-08-25 DIAGNOSIS — E878 Other disorders of electrolyte and fluid balance, not elsewhere classified: Secondary | ICD-10-CM | POA: Diagnosis not present

## 2022-08-25 DIAGNOSIS — M86171 Other acute osteomyelitis, right ankle and foot: Secondary | ICD-10-CM | POA: Diagnosis not present

## 2022-08-25 DIAGNOSIS — Z89411 Acquired absence of right great toe: Secondary | ICD-10-CM | POA: Diagnosis not present

## 2022-08-25 DIAGNOSIS — E1069 Type 1 diabetes mellitus with other specified complication: Secondary | ICD-10-CM | POA: Diagnosis not present

## 2022-08-25 DIAGNOSIS — Z794 Long term (current) use of insulin: Secondary | ICD-10-CM | POA: Diagnosis not present

## 2022-08-25 DIAGNOSIS — T383X6A Underdosing of insulin and oral hypoglycemic [antidiabetic] drugs, initial encounter: Secondary | ICD-10-CM | POA: Diagnosis not present

## 2022-08-25 DIAGNOSIS — E109 Type 1 diabetes mellitus without complications: Secondary | ICD-10-CM | POA: Diagnosis not present

## 2022-08-25 DIAGNOSIS — R600 Localized edema: Secondary | ICD-10-CM | POA: Diagnosis not present

## 2022-08-25 DIAGNOSIS — E1042 Type 1 diabetes mellitus with diabetic polyneuropathy: Secondary | ICD-10-CM | POA: Diagnosis not present

## 2022-08-25 DIAGNOSIS — E1110 Type 2 diabetes mellitus with ketoacidosis without coma: Secondary | ICD-10-CM | POA: Diagnosis not present

## 2022-08-25 DIAGNOSIS — M868X7 Other osteomyelitis, ankle and foot: Secondary | ICD-10-CM | POA: Diagnosis not present

## 2022-08-25 DIAGNOSIS — R739 Hyperglycemia, unspecified: Secondary | ICD-10-CM | POA: Diagnosis not present

## 2022-08-25 DIAGNOSIS — E782 Mixed hyperlipidemia: Secondary | ICD-10-CM | POA: Diagnosis not present

## 2022-08-25 DIAGNOSIS — R000 Tachycardia, unspecified: Secondary | ICD-10-CM | POA: Diagnosis not present

## 2022-08-25 DIAGNOSIS — R42 Dizziness and giddiness: Secondary | ICD-10-CM | POA: Diagnosis not present

## 2022-08-25 DIAGNOSIS — S91101A Unspecified open wound of right great toe without damage to nail, initial encounter: Secondary | ICD-10-CM | POA: Diagnosis not present

## 2022-08-25 DIAGNOSIS — Z833 Family history of diabetes mellitus: Secondary | ICD-10-CM | POA: Diagnosis not present

## 2022-08-29 DIAGNOSIS — E1110 Type 2 diabetes mellitus with ketoacidosis without coma: Secondary | ICD-10-CM | POA: Diagnosis not present

## 2022-10-02 DIAGNOSIS — S90421A Blister (nonthermal), right great toe, initial encounter: Secondary | ICD-10-CM | POA: Diagnosis not present

## 2022-10-02 DIAGNOSIS — L97511 Non-pressure chronic ulcer of other part of right foot limited to breakdown of skin: Secondary | ICD-10-CM | POA: Diagnosis not present

## 2022-10-06 DIAGNOSIS — E063 Autoimmune thyroiditis: Secondary | ICD-10-CM | POA: Diagnosis not present

## 2022-10-06 DIAGNOSIS — E10621 Type 1 diabetes mellitus with foot ulcer: Secondary | ICD-10-CM | POA: Diagnosis not present

## 2022-10-06 DIAGNOSIS — E041 Nontoxic single thyroid nodule: Secondary | ICD-10-CM | POA: Diagnosis not present

## 2022-11-25 DIAGNOSIS — R5383 Other fatigue: Secondary | ICD-10-CM | POA: Diagnosis not present

## 2022-11-25 DIAGNOSIS — E109 Type 1 diabetes mellitus without complications: Secondary | ICD-10-CM | POA: Diagnosis not present

## 2022-11-25 DIAGNOSIS — R0609 Other forms of dyspnea: Secondary | ICD-10-CM | POA: Diagnosis not present

## 2022-11-25 DIAGNOSIS — E785 Hyperlipidemia, unspecified: Secondary | ICD-10-CM | POA: Diagnosis not present

## 2022-11-25 DIAGNOSIS — R030 Elevated blood-pressure reading, without diagnosis of hypertension: Secondary | ICD-10-CM | POA: Diagnosis not present

## 2022-11-25 DIAGNOSIS — R0600 Dyspnea, unspecified: Secondary | ICD-10-CM | POA: Diagnosis not present

## 2022-11-25 DIAGNOSIS — E669 Obesity, unspecified: Secondary | ICD-10-CM | POA: Diagnosis not present

## 2022-12-04 DIAGNOSIS — R0609 Other forms of dyspnea: Secondary | ICD-10-CM | POA: Diagnosis not present

## 2022-12-11 DIAGNOSIS — E041 Nontoxic single thyroid nodule: Secondary | ICD-10-CM | POA: Diagnosis not present

## 2022-12-11 DIAGNOSIS — E063 Autoimmune thyroiditis: Secondary | ICD-10-CM | POA: Diagnosis not present

## 2022-12-17 DIAGNOSIS — R0600 Dyspnea, unspecified: Secondary | ICD-10-CM | POA: Diagnosis not present

## 2023-02-02 DIAGNOSIS — E041 Nontoxic single thyroid nodule: Secondary | ICD-10-CM | POA: Diagnosis not present

## 2023-02-02 DIAGNOSIS — E10621 Type 1 diabetes mellitus with foot ulcer: Secondary | ICD-10-CM | POA: Diagnosis not present

## 2023-02-02 DIAGNOSIS — E063 Autoimmune thyroiditis: Secondary | ICD-10-CM | POA: Diagnosis not present
